# Patient Record
Sex: Female | Born: 1985 | Race: White | Hispanic: No | Marital: Single | Smoking: Former smoker
Health system: Southern US, Community
[De-identification: ages and names within clinical notes are randomized; demographics above are authoritative.]

## PROBLEM LIST (undated history)

## (undated) ENCOUNTER — Inpatient Hospital Stay (HOSPITAL_COMMUNITY): Payer: Self-pay

## (undated) DIAGNOSIS — E669 Obesity, unspecified: Secondary | ICD-10-CM

## (undated) DIAGNOSIS — R109 Unspecified abdominal pain: Secondary | ICD-10-CM

## (undated) DIAGNOSIS — N200 Calculus of kidney: Secondary | ICD-10-CM

## (undated) DIAGNOSIS — K8020 Calculus of gallbladder without cholecystitis without obstruction: Secondary | ICD-10-CM

## (undated) DIAGNOSIS — E119 Type 2 diabetes mellitus without complications: Secondary | ICD-10-CM

## (undated) DIAGNOSIS — K219 Gastro-esophageal reflux disease without esophagitis: Secondary | ICD-10-CM

## (undated) DIAGNOSIS — F32A Depression, unspecified: Secondary | ICD-10-CM

## (undated) DIAGNOSIS — F329 Major depressive disorder, single episode, unspecified: Secondary | ICD-10-CM

## (undated) DIAGNOSIS — R87619 Unspecified abnormal cytological findings in specimens from cervix uteri: Secondary | ICD-10-CM

## (undated) DIAGNOSIS — O139 Gestational [pregnancy-induced] hypertension without significant proteinuria, unspecified trimester: Secondary | ICD-10-CM

## (undated) DIAGNOSIS — IMO0002 Reserved for concepts with insufficient information to code with codable children: Secondary | ICD-10-CM

## (undated) HISTORY — PX: FOOT SURGERY: SHX648

## (undated) HISTORY — DX: Gastro-esophageal reflux disease without esophagitis: K21.9

## (undated) HISTORY — PX: TONSILLECTOMY: SUR1361

## (undated) HISTORY — DX: Unspecified abdominal pain: R10.9

---
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
## 2007-06-25 ENCOUNTER — Encounter (INDEPENDENT_AMBULATORY_CARE_PROVIDER_SITE_OTHER): Payer: Self-pay | Admitting: *Deleted

## 2008-10-21 ENCOUNTER — Emergency Department: Payer: Self-pay | Admitting: Emergency Medicine

## 2009-08-06 ENCOUNTER — Encounter: Payer: Self-pay | Admitting: Family Medicine

## 2009-08-07 ENCOUNTER — Ambulatory Visit: Payer: Self-pay | Admitting: Family Medicine

## 2009-08-07 DIAGNOSIS — Z87448 Personal history of other diseases of urinary system: Secondary | ICD-10-CM | POA: Insufficient documentation

## 2009-08-07 DIAGNOSIS — J45909 Unspecified asthma, uncomplicated: Secondary | ICD-10-CM | POA: Insufficient documentation

## 2009-08-07 DIAGNOSIS — IMO0002 Reserved for concepts with insufficient information to code with codable children: Secondary | ICD-10-CM | POA: Insufficient documentation

## 2009-08-07 DIAGNOSIS — G43909 Migraine, unspecified, not intractable, without status migrainosus: Secondary | ICD-10-CM | POA: Insufficient documentation

## 2009-08-07 DIAGNOSIS — K8020 Calculus of gallbladder without cholecystitis without obstruction: Secondary | ICD-10-CM | POA: Insufficient documentation

## 2009-08-07 DIAGNOSIS — Z9189 Other specified personal risk factors, not elsewhere classified: Secondary | ICD-10-CM | POA: Insufficient documentation

## 2009-08-07 LAB — CONVERTED CEMR LAB
Beta hcg, urine, semiquantitative: NEGATIVE
Bilirubin Urine: NEGATIVE
Ketones, urine, test strip: NEGATIVE
Specific Gravity, Urine: 1.015
Urobilinogen, UA: 0.2

## 2009-08-13 ENCOUNTER — Encounter: Payer: Self-pay | Admitting: Family Medicine

## 2009-10-08 ENCOUNTER — Telehealth: Payer: Self-pay | Admitting: Family Medicine

## 2009-10-08 ENCOUNTER — Ambulatory Visit: Payer: Self-pay | Admitting: Sports Medicine

## 2009-10-26 ENCOUNTER — Ambulatory Visit: Payer: Self-pay | Admitting: Family Medicine

## 2009-10-26 LAB — CONVERTED CEMR LAB
Albumin: 4 g/dL (ref 3.5–5.2)
Basophils Absolute: 0.1 10*3/uL (ref 0.0–0.1)
Basophils Relative: 1 % (ref 0.0–3.0)
CO2: 26 meq/L (ref 19–32)
Calcium: 9 mg/dL (ref 8.4–10.5)
Creatinine, Ser: 0.7 mg/dL (ref 0.4–1.2)
Glucose, Bld: 87 mg/dL (ref 70–99)
HDL: 47.3 mg/dL (ref >39.00)
Hemoglobin: 12.1 g/dL (ref 12.0–15.0)
Lymphocytes Relative: 40.2 % (ref 12.0–46.0)
Monocytes Relative: 7.8 % (ref 3.0–12.0)
Neutro Abs: 3.9 10*3/uL (ref 1.4–7.7)
RBC: 3.92 M/uL (ref 3.87–5.11)
Total Protein: 6.7 g/dL (ref 6.0–8.3)
Triglycerides: 55 mg/dL (ref 0.0–149.0)

## 2009-10-28 ENCOUNTER — Ambulatory Visit: Payer: Self-pay | Admitting: Family Medicine

## 2009-10-28 LAB — CONVERTED CEMR LAB
Bilirubin Urine: NEGATIVE
Glucose, Urine, Semiquant: NEGATIVE
Protein, U semiquant: NEGATIVE
Specific Gravity, Urine: 1.02
pH: 5.5

## 2009-10-30 LAB — CONVERTED CEMR LAB: Chlamydia, DNA Probe: NEGATIVE

## 2009-11-05 ENCOUNTER — Ambulatory Visit: Payer: Self-pay | Admitting: Family Medicine

## 2009-11-05 ENCOUNTER — Encounter: Payer: Self-pay | Admitting: Family Medicine

## 2009-12-30 ENCOUNTER — Encounter (INDEPENDENT_AMBULATORY_CARE_PROVIDER_SITE_OTHER): Payer: Self-pay | Admitting: *Deleted

## 2009-12-30 ENCOUNTER — Ambulatory Visit: Payer: Self-pay | Admitting: Internal Medicine

## 2010-01-09 LAB — CONVERTED CEMR LAB
Pap Smear: NORMAL
Pap Smear: NORMAL
Pap Smear: NORMAL

## 2010-02-12 ENCOUNTER — Ambulatory Visit: Payer: Self-pay | Admitting: Family Medicine

## 2010-02-12 ENCOUNTER — Encounter (INDEPENDENT_AMBULATORY_CARE_PROVIDER_SITE_OTHER): Payer: Self-pay | Admitting: *Deleted

## 2010-02-22 ENCOUNTER — Ambulatory Visit: Payer: Self-pay | Admitting: Family Medicine

## 2010-04-23 ENCOUNTER — Ambulatory Visit
Admission: RE | Admit: 2010-04-23 | Discharge: 2010-04-23 | Payer: Self-pay | Source: Home / Self Care | Attending: Family Medicine | Admitting: Family Medicine

## 2010-04-28 ENCOUNTER — Ambulatory Visit: Admit: 2010-04-28 | Payer: Self-pay | Admitting: Family Medicine

## 2010-05-04 ENCOUNTER — Other Ambulatory Visit: Payer: Self-pay | Admitting: Family Medicine

## 2010-05-04 ENCOUNTER — Other Ambulatory Visit (HOSPITAL_COMMUNITY)
Admission: RE | Admit: 2010-05-04 | Discharge: 2010-05-04 | Disposition: A | Discharge status: Home / Self Care | Payer: BC Managed Care – PPO | Source: Ambulatory Visit | Attending: Family Medicine | Admitting: Family Medicine

## 2010-05-04 ENCOUNTER — Encounter: Payer: Self-pay | Admitting: Family Medicine

## 2010-05-04 ENCOUNTER — Ambulatory Visit
Admission: RE | Admit: 2010-05-04 | Discharge: 2010-05-04 | Payer: Self-pay | Source: Home / Self Care | Attending: Family Medicine | Admitting: Family Medicine

## 2010-05-04 DIAGNOSIS — Z1159 Encounter for screening for other viral diseases: Secondary | ICD-10-CM | POA: Insufficient documentation

## 2010-05-04 DIAGNOSIS — Z113 Encounter for screening for infections with a predominantly sexual mode of transmission: Secondary | ICD-10-CM | POA: Insufficient documentation

## 2010-05-04 DIAGNOSIS — R87810 Cervical high risk human papillomavirus (HPV) DNA test positive: Secondary | ICD-10-CM | POA: Insufficient documentation

## 2010-05-04 DIAGNOSIS — Z0189 Encounter for other specified special examinations: Secondary | ICD-10-CM | POA: Insufficient documentation

## 2010-05-06 ENCOUNTER — Telehealth: Payer: Self-pay | Admitting: Family Medicine

## 2010-05-06 LAB — CONVERTED CEMR LAB
Hep B C IgM: NEGATIVE
Hepatitis B Surface Ag: NEGATIVE

## 2010-05-07 ENCOUNTER — Ambulatory Visit: Admit: 2010-05-07 | Payer: Self-pay | Admitting: Family Medicine

## 2010-05-11 NOTE — Letter (Signed)
Summary: Out of Work  [employer] at [facility]  [address] [place], [place] [address]   Phone: [phone]  Fax: [phone]    February 12, 2010   Employee:  Stefanie Kelley    To Whom It May Concern:   For Medical reasons, please excuse the above named employee from work for the following dates:  Start:  February 12, 2010 3:02 PM   End:   May return February 13, 2010  If you need additional information, please feel free to contact our office.         Sincerely,    Kerby Nora MD

## 2010-05-11 NOTE — Assessment & Plan Note (Signed)
Summary: NEW PT TO EST/CLE   Vital Signs:  Patient profile:   25 year old female Height:      66 inches Weight:      243.0 pounds BMI:     39.36 Temp:     97.9 degrees F oral Pulse rate:   72 / minute Pulse rhythm:   regular BP sitting:   110 / 72  (left arm) Cuff size:   regular  Vitals Entered By: Benny Lennert CMA Duncan Dull) (August 07, 2009 10:23 AM)  History of Present Illness: Chief complaint new patient to be established   Presyncopal spells.. had labs eval at [facility]..nml.  Intermittant in last 3 year.  In last year more spells of face flushing. lightheadedness, heart racing. Has passed out in past. Had lab eval 2 years ago.  Anxiety, intermiattant. Occ panic attacks..seen in ER once EKG normal.  Frequent headaches Has seen Dr. Willeen Cass ENT. Referred to [facility]..has appt in 1 week.  In last 3 months  B lower abdominal pressure during menses, pain with inserting tampon. Heavier menses in last few months, larger blood clots.  HAs noted several tender lumps in upper abdomen..noted in last 1 month.   Preventive Screening-Counseling & Management  Caffeine-Diet-Exercise     Does Patient Exercise: yes      Drug Use:  no.    Problems Prior to Update: 1)  Menorrhagia  (ICD-626.2) 2)  Abdominal Pain, Lower  (ICD-789.09) 3)  Hx of Pre-eclampsia  (ICD-642.40) 4)  Cholelithiasis, Asymptomatic  (ICD-574.20) 5)  Other Anxiety States  (ICD-300.09) 6)  Family History Diabetes 1st Degree Relative  (ICD-V18.0) 7)  Uti's, Hx of  (ICD-V13.00) 8)  Gerd  (ICD-530.81) 9)  Migraine Headache  (ICD-346.90) 10)  Dizziness  (ICD-780.4) 11)  Chickenpox, Hx of  (ICD-V15.9) 12)  Asthma, Unspecified, Unspecified Status  (ICD-493.90)  Current Medications (verified): 1)  None  Allergies (verified): No Known Drug Allergies  Past History:  Past medical, surgical, family and social histories (including risk factors) reviewed, and no changes noted (except as noted  below).  Past Medical History: Current Problems:  UTI'S, HX OF (ICD-V13.00) NEPHROLITHIASIS, HX OF (ICD-V13.01) GERD (ICD-530.81) ELEVATED BLOOD PRESSURE WITHOUT DIAGNOSIS OF HYPERTENSION (ICD-796.2) MIGRAINE HEADACHE (ICD-346.90) VASOVAGAL SYNCOPE (ICD-780.2) BRONCHITIS, CHRONIC (ICD-491.9) CHICKENPOX, HX OF (ICD-V15.9) ASTHMA (ICD-493.90)    Past Surgical History: tonsils and adenoids 2000  Family History: Reviewed history and no changes required. Father: limited contact, DM  mother: thyroid issues, high chol, HTN Brother: DM  Social History: Reviewed history and no changes required. Divorced Alcohol use-no Drug use-no Regular exercise-yes Drug Use:  no Does Patient Exercise:  yes  Review of Systems General:  Complains of fatigue; denies fever. CV:  Complains of palpitations; denies chest pain or discomfort, difficulty breathing at night, and fatigue. Resp:  Denies shortness of breath. GI:  Complains of constipation; denies abdominal pain, bloody stools, dark tarry stools, and diarrhea. GU:  Complains of urinary frequency; denies dysuria; burning vaginally with urination, but not with urine coming through.  Physical Exam  General:  Obese appearing female in NAD Ears:  External ear exam shows no significant lesions or deformities.  Otoscopic examination reveals clear canals, tympanic membranes are intact bilaterally without bulging, retraction, inflammation or discharge. Hearing is grossly normal bilaterally. Nose:  External nasal examination shows no deformity or inflammation. Nasal mucosa are pink and moist without lesions or exudates. Mouth:  Oral mucosa and oropharynx without lesions or exudates.  Teeth in good repair. Neck:  no  carotid bruit or thyromegaly no cervical or supraclavicular lymphadenopathy  Lungs:  Normal respiratory effort, chest expands symmetrically. Lungs are clear to auscultation, no crackles or wheezes. Heart:  Normal rate and regular rhythm.  S1 and S2 normal without gallop, murmur, click, rub or other extra sounds. Abdomen:  Bowel sounds positive,abdomen soft and mild diffuse lower abdtender without masses, organomegaly or hernias noted. 2 0.5 cm nodules in subcutaneous fat B upper abdomne..no intraabdominal lesions palpated Pulses:  R and L posterior tibial pulses are full and equal bilaterally  Extremities:  no edema  Neurologic:  No cranial nerve deficits noted. Station and gait are normal. . Sensory, motor and coordinative functions appear intact. Skin:  Intact without suspicious lesions or rashes Psych:  Cognition and judgment appear intact. Alert and cooperative with normal attention span and concentration. No apparent delusions, illusions, hallucinations   Impression & Recommendations:  Problem # 1:  DIZZINESS (ICD-780.4)  Presyncope, facial flushing and lightheadedness.    EKG showed: Will eval with labs.Marland Kitchenanemia given heavy menses, thyroid   Orders: EKG w/ Interpretation (93000)  Problem # 2:  ABDOMINAL PAIN, LOWER (ICD-789.09) UA clear Upreg  Will have her return for vaginal exam, GC/Chlam testing. If pain localized to ovaries...consider pelic [place] to eval at that time.  ? endometriosis given worse during menses...may need GYN referral.  Orders: UA Dipstick w/o Micro (manual) ([address]) Urine Pregnancy Test  ([address]454) T-Culture, Urine (09811-91478) Specimen Handling (99000)  Problem # 3:  MIGRAINE HEADACHE (ICD-346.90) [facility]   Problem # 4:  MENORRHAGIA (ICD-626.2) See #2. AEval for anemia.   Patient Instructions: 1)  Fasting LIpids, CMET Dx v77.91, cbc diff, TSH, B12 Dx 780.4 2)  Make appt for 30 min in 2 weeks for further eval of low abdominal pain.   Current Allergies (reviewed today): No known allergies   PAP Result Date:  01/09/2010 PAP Result:  normal  Laboratory Results   Urine Tests   Date/Time Reported: August 07, 2009 11:23 AM   Routine Urinalysis     Color: yellow Appearance: Cloudy Glucose: negative   (Normal Range: Negative) Bilirubin: negative   (Normal Range: Negative) Ketone: negative   (Normal Range: Negative) Spec. Gravity: 1.015   (Normal Range: 1.003-1.035) Blood: large   (Normal Range: Negative) pH: 7.0   (Normal Range: 5.0-8.0) Protein: trace   (Normal Range: Negative) Urobilinogen: 0.2   (Normal Range: 0-1) Nitrite: negative   (Normal Range: Negative) Leukocyte Esterace: moderate   (Normal Range: Negative)  Urine Microscopic WBC/HPF: occ RBC/HPF: occ Epithelial/HPF: tntc    Urine HCG: negative Comments: contaminated     Laboratory Results   Urine Tests    Routine Urinalysis   Color: yellow Appearance: Cloudy Glucose: negative   (Normal Range: Negative) Bilirubin: negative   (Normal Range: Negative) Ketone: negative   (Normal Range: Negative) Spec. Gravity: 1.015   (Normal Range: 1.003-1.035) Blood: large   (Normal Range: Negative) pH: 7.0   (Normal Range: 5.0-8.0) Protein: trace   (Normal Range: Negative) Urobilinogen: 0.2   (Normal Range: 0-1) Nitrite: negative   (Normal Range: Negative) Leukocyte Esterace: moderate   (Normal Range: Negative)  Urine Microscopic WBC/hpf: occ RBC/hpf: occ Epithelial: tntc    Urine HCG: negative Comments: contaminated

## 2010-05-11 NOTE — Assessment & Plan Note (Signed)
Summary: EARS,COUGH,HA/CLE   Vital Signs:  Patient profile:   25 year old female Height:      66 inches Weight:      246 pounds BMI:     39.85 Temp:     98.6 degrees F oral Pulse rate:   72 / minute Pulse rhythm:   regular BP sitting:   114 / 76  (left arm) Cuff size:   large  Vitals Entered By: Lewanda Rife LPN (February 12, 2010 2:43 PM) CC: Lt earaches and knot behind lt ear, non priductive cough, h/a   Acute Visit History:      The patient complains of earache, nasal discharge, sinus problems, and sore throat.  These symptoms began 2 weeks ago.  She denies fever.  Other comments include: Daughter is sick  Initiatl symptoms improved one week ago with tylenol sinus, inhaler, Nyquil.Marland Kitchensymptoms got worse again 3 days ago..nasal mucus changed color , B ears hurting..SOB and cough better..not using albuterol now. .        The character of the cough is described as nonproductive.        The earache is located on both sides.  There is no history of recent antibiotic usage or recurrent otitis media associated with the earache.        She complains of sinus pressure, teeth aching, ears being blocked, nasal congestion, and purulent drainage.             Problems Prior to Update: 1)  Cough  (ICD-786.2) 2)  Screening For Lipoid Disorders  (ICD-V77.91) 3)  Menorrhagia  (ICD-626.2) 4)  Abdominal Pain, Lower  (ICD-789.09) 5)  Hx of Pre-eclampsia  (ICD-642.40) 6)  Cholelithiasis, Asymptomatic  (ICD-574.20) 7)  Other Anxiety States  (ICD-300.09) 8)  Family History Diabetes 1st Degree Relative  (ICD-V18.0) 9)  Uti's, Hx of  (ICD-V13.00) 10)  Gerd  (ICD-530.81) 11)  Migraine Headache  (ICD-346.90) 12)  Dizziness  (ICD-780.4) 13)  Chickenpox, Hx of  (ICD-V15.9) 14)  Asthma, Unspecified, Unspecified Status  (ICD-493.90)  Current Medications (verified): 1)  Ventolin Hfa 108 (90 Base) Mcg/act Aers (Albuterol Sulfate) .... Take 2 Puffs Q4-6 Hours As Needed Wheezing/sob 2)  Tessalon Perles 100  Mg Caps (Benzonatate) .... One By Mouth Three Times A Day As Needed Cough  Allergies (verified): No Known Drug Allergies  Past History:  Past medical, surgical, family and social histories (including risk factors) reviewed, and no changes noted (except as noted below).  Past Medical History: Reviewed history from 08/07/2009 and no changes required. Current Problems:  UTI'S, HX OF (ICD-V13.00) NEPHROLITHIASIS, HX OF (ICD-V13.01) GERD (ICD-530.81) ELEVATED BLOOD PRESSURE WITHOUT DIAGNOSIS OF HYPERTENSION (ICD-796.2) MIGRAINE HEADACHE (ICD-346.90) VASOVAGAL SYNCOPE (ICD-780.2) BRONCHITIS, CHRONIC (ICD-491.9) CHICKENPOX, HX OF (ICD-V15.9) ASTHMA (ICD-493.90)    Past Surgical History: Reviewed history from 08/07/2009 and no changes required. tonsils and adenoids 2000  Family History: Reviewed history from 08/07/2009 and no changes required. Father: limited contact, DM  mother: thyroid issues, high chol, HTN Brother: DM  Social History: Reviewed history from 08/07/2009 and no changes required. Divorced Alcohol use-no Drug use-no Regular exercise-yes  Review of Systems General:  Complains of fatigue; no body aches. CV:  Denies chest pain or discomfort. Resp:  Complains of shortness of breath and wheezing; cough currently resolved.  Physical Exam  General:  Well-developed,well-nourished,in no acute distress; alert,appropriate and cooperative throughout examination Head:  ttp B maxillary sinuses Ears:  clear fluids B TMs Nose:  nasal discharge, no mucosal pallor.   Mouth:  Oral  mucosa and oropharynx without lesions or exudates.  Teeth in good repair. Neck:  no cervical or supraclavicular lymphadenopathy no carotid bruit or thyromegaly  Lungs:  Normal respiratory effort, chest expands symmetrically. Lungs are clear to auscultation, no crackles or wheezes. Heart:  Normal rate and regular rhythm. S1 and S2 normal without gallop, murmur, click, rub or other extra  sounds.   Impression & Recommendations:  Problem # 1:  SINUSITIS, ACUTE (ICD-461.9)  Her updated medication list for this problem includes:    Tessalon Perles 100 Mg Caps (Benzonatate) ..... One by mouth three times a day as needed cough    Amoxicillin 500 Mg Caps (Amoxicillin) .Marland Kitchen... 2 tab by mouth two times a day x 10 days  Instructed on treatment. Call if symptoms persist or worsen.   Complete Medication List: 1)  Ventolin Hfa 108 (90 Base) Mcg/act Aers (Albuterol sulfate) .... Take 2 puffs q4-6 hours as needed wheezing/sob 2)  Tessalon Perles 100 Mg Caps (Benzonatate) .... One by mouth three times a day as needed cough 3)  Amoxicillin 500 Mg Caps (Amoxicillin) .... 2 tab by mouth two times a day x 10 days  Patient Instructions: 1)  Start antibitoics. 2)  Complete full course. 3)  Continue nasal saline 3-4 times ad day. 4)  Mucinex two times a day.  Prescriptions: AMOXICILLIN 500 MG CAPS (AMOXICILLIN) 2 tab by mouth two times a day x 10 days  #40 x 0   Entered and Authorized by:   Kerby Nora MD   Signed by:   Kerby Nora MD on 02/12/2010   Method used:   Electronically to        [facility]* (retail)       [address]       [place], [place]  [address]       Ph: [phone]       Fax: [phone]   RxID:   [id number]    Orders Added: 1)  Est. Patient Level III [84132]    Current Allergies (reviewed today): No known allergies

## 2010-05-11 NOTE — Progress Notes (Signed)
Summary: bloody diarrhea  Phone Note Call from Patient   Caller: Patient Call For: Kerby Nora MD Summary of Call: Call from pt.  She was seen at [facility] clinic last week and was given omnicef for strep.  She developed bloody diarrhea and stopped the medicine.  The doctor there then gave her biaxin but told her not to start this until today.  She still has the bloody diarrhea so she hasnt started the medicine.  She also has bad abd pain and had fever of 101 last pm.  I advised pt that she needs to be seen and should probably call [facility] to see what they want her to do, since we dont have any appts available here today.  She did have appt scheduled here on 6/22 but she cancelled. Initial call taken by: Lowella Petties CMA,  October 08, 2009 12:30 PM  Follow-up for Phone Call        would recommend evaluation.   tomorrow is probably acceptable. Follow-up by: Hannah Beat MD,  October 08, 2009 4:59 PM  Additional Follow-up for Phone Call Additional follow up Details #1::        Please find out status of this pt...was she seen?  Additional Follow-up by: Kerby Nora MD,  October 09, 2009 2:00 PM    Additional Follow-up for Phone Call Additional follow up Details #2::    Patient is still having the same issues went to [facility] clinic yesterday and they did CT scan and she has a infection in her intestines and she has to be out of work until she has alot of other test done by GI. [facility] says its a rare infection that they dont see much.. Patient will call us back after she gets test done.Consuello Masse CMA   Follow-up by: Benny Lennert CMA Duncan Dull),  October 09, 2009 2:19 PM

## 2010-05-11 NOTE — Assessment & Plan Note (Signed)
Summary: ROA FOR 2 WEEK FOLLOW-UP/JRR   Vital Signs:  Patient profile:   25 year old female Height:      66 inches Weight:      233.4 pounds BMI:     37.81 Temp:     99.1 degrees F oral Pulse rate:   72 / minute Pulse rhythm:   regular BP sitting:   118 / 80  (left arm) Cuff size:   regular  Vitals Entered By: Benny Lennert CMA Duncan Dull) (October 28, 2009 9:20 AM)  History of Present Illness: Chief complaint 2 week follow up (Patient was to return in May for a 2 week follow up for lower abdominal pain).  Low abdominal pain chronic continues x 12 months...left lower or right lower "toothache-like pain"   Worse when on menses. Recently had worse episode..more severe lasting several hours...pain across entire lower abdomen. No nausea, no vomiting, no diarrehea, constipation.  Occ dysuria...describes as burining at urethral opening after urination..no vaginal burning or itching.  OCc greensih discharge after wiping from [employer]..not sure vag or urinary source.  No current sexual actvity...but has in past.  no pain with sex...last sex 1-2 years ago.    Did have salmonella in 09/2009 causing bloody diarrhea... resolved now after Cipro.  Had nml CT scan of abd and pelvis nml. 9 ABove low abdominal pain was not connected to symptoms from salmonella)  Last pap in 01/2010  Last OV..neg UA, neg Upreg.  Recent labs nml.  Migraine..seen at headache clinic....no migranes in last month. Given topamax...did not tolerate.  Problems Prior to Update: 1)  Screening For Lipoid Disorders  (ICD-V77.91) 2)  Menorrhagia  (ICD-626.2) 3)  Abdominal Pain, Lower  (ICD-789.09) 4)  Hx of Pre-eclampsia  (ICD-642.40) 5)  Cholelithiasis, Asymptomatic  (ICD-574.20) 6)  Other Anxiety States  (ICD-300.09) 7)  Family History Diabetes 1st Degree Relative  (ICD-V18.0) 8)  Uti's, Hx of  (ICD-V13.00) 9)  Gerd  (ICD-530.81) 10)  Migraine Headache  (ICD-346.90) 11)  Dizziness  (ICD-780.4) 12)  Chickenpox, Hx  of  (ICD-V15.9) 13)  Asthma, Unspecified, Unspecified Status  (ICD-493.90)  Current Medications (verified): 1)  None  Allergies (verified): No Known Drug Allergies  Past History:  Past medical, surgical, family and social histories (including risk factors) reviewed, and no changes noted (except as noted below).  Past Medical History: Reviewed history from 08/07/2009 and no changes required. Current Problems:  UTI'S, HX OF (ICD-V13.00) NEPHROLITHIASIS, HX OF (ICD-V13.01) GERD (ICD-530.81) ELEVATED BLOOD PRESSURE WITHOUT DIAGNOSIS OF HYPERTENSION (ICD-796.2) MIGRAINE HEADACHE (ICD-346.90) VASOVAGAL SYNCOPE (ICD-780.2) BRONCHITIS, CHRONIC (ICD-491.9) CHICKENPOX, HX OF (ICD-V15.9) ASTHMA (ICD-493.90)    Past Surgical History: Reviewed history from 08/07/2009 and no changes required. tonsils and adenoids 2000  Family History: Reviewed history from 08/07/2009 and no changes required. Father: limited contact, DM  mother: thyroid issues, high chol, HTN Brother: DM  Social History: Reviewed history from 08/07/2009 and no changes required. Divorced Alcohol use-no Drug use-no Regular exercise-yes  Review of Systems General:  Complains of fatigue; denies fever and loss of appetite. CV:  Denies chest pain or discomfort. Resp:  Denies shortness of breath, sputum productive, and wheezing. GI:  Denies constipation, gas, nausea, and vomiting. GU:  Complains of dysuria; denies abnormal vaginal bleeding, urinary frequency, and urinary hesitancy.  Physical Exam  General:  obese appearing female in NAd Mouth:  Oral mucosa and oropharynx without lesions or exudates.  Teeth in good repair. Neck:  no carotid bruit or thyromegaly no cervical or supraclavicular lymphadenopathy  Lungs:  Normal respiratory effort, chest expands symmetrically. Lungs are clear to auscultation, no crackles or wheezes. Heart:  Normal rate and regular rhythm. S1 and S2 normal without gallop, murmur, click,  rub or other extra sounds. Abdomen:  ttp B lower abdomen, soft, normal bowel sounds, no guarding, no rigidity, no rebound tenderness, no abdominal hernia, no inguinal hernia, no hepatomegaly, and no splenomegaly.   Genitalia:  Pelvic Exam:        External: normal female genitalia without lesions or masses        Vagina: normal without lesions or masses        Cervix: normal without lesions or masses        Adnexa: normal bimanual exam without masses or fullness        Uterus: normal by palpation        Pap smear: not performed Pulses:  R and L posterior tibial pulses are full and equal bilaterally  Extremities:  no edema  Skin:  Intact without suspicious lesions or rashes Psych:  Cognition and judgment appear intact. Alert and cooperative with normal attention span and concentration. No apparent delusions, illusions, hallucinations   Impression & Recommendations:  Problem # 1:  ABDOMINAL PAIN, LOWER (ICD-789.09) UA clear again today . Preg test at last OV neg. Pelvic exam showed no cervical motion tenderness...GC and chlam performed and pending. Will eval ovaries with [place].  Recent CT of abd/pelvis was nml. Given association with menses...there is concern for endometriosis as cause...if US shows no cysts...consider referral to GYN or treatment emperically with OCPs.  Orders: UA Dipstick w/o Micro (manual) ([address]) T-GC Probe, genital ([address]-16606) T-Chlamydia Probe, genital (30160-10932) Radiology Referral (Radiology)  Problem # 2:  MIGRAINE HEADACHE (ICD-346.90) Improved. Treated with OTC meds. Rec migraine diary. No current indication for prophylaxsis at current frequency.   Patient Instructions: 1)  Schediule CPX in 02/2010. 2)  No labs prior. 3)  Referral Appointment Information 4)  Day/Date: 5)  Time: 6)  Place/MD: 7)  Address: 8)  Phone/Fax: 9)  Patient given appointment information. Information/Orders faxed/mailed.   Prior Medications (reviewed today): None Current  Allergies (reviewed today): No known allergies   Laboratory Results   Urine Tests  Date/Time Received: October 28, 2009 10:25 AM  Date/Time Reported: October 28, 2009 10:25 AM   Routine Urinalysis   Color: yellow Appearance: Clear Glucose: negative   (Normal Range: Negative) Bilirubin: negative   (Normal Range: Negative) Ketone: negative   (Normal Range: Negative) Spec. Gravity: 1.020   (Normal Range: 1.003-1.035) Blood: negative   (Normal Range: Negative) pH: 5.5   (Normal Range: 5.0-8.0) Protein: negative   (Normal Range: Negative) Urobilinogen: 0.2   (Normal Range: 0-1) Nitrite: negative   (Normal Range: Negative) Leukocyte Esterace: negative   (Normal Range: Negative)

## 2010-05-11 NOTE — Letter (Signed)
Summary: Out of Work  [employer] at [facility]  [address] [place], [place] [address]   Phone: [phone]  Fax: [phone]    December 30, 2009   Employee:  Priscella Mann    To Whom It May Concern:   For Medical reasons, please excuse the above named employee from work for the following dates:  Start:  December 30, 2009   End:  December 30, 2009   If you need additional information, please feel free to contact our office.         Sincerely,       Selena Batten Dance CMA (AAMA)

## 2010-05-11 NOTE — Assessment & Plan Note (Signed)
Summary: cough and body aches   Vital Signs:  Patient profile:   25 year old female Weight:      245.75 pounds O2 Sat:      99 % on Room air Temp:     98.3 degrees F oral Pulse rate:   80 / minute Pulse rhythm:   regular BP sitting:   118 / 78  (left arm) Cuff size:   large  Vitals Entered By: Selena Batten Dance CMA (AAMA) (December 30, 2009 8:24 AM)  O2 Flow:  Room air CC: Cough/body aches/SOB   History of Present Illness: cc: cough/body aches  1 1/2 wk ago started with HA, RN.  5 days ago work up with chest pain, then yesterday with body aches, HA, cough, RN, ear pain.  Hs tried Alleve, saline nasal spray.    No fever.  Not much sinus pressure.  No abd pain, n/v/d/c.  No tooth pain   + daughter sick with ear infections and sinus infection + grandmother sick with bronchitis and sinus infection  No smokers at home.  Current Medications (verified): 1)  Ortho-Cyclen (28) 0.25-35 Mg-Mcg Tabs (Norgestimate-Eth Estradiol) .Marland Kitchen.. 1 Tab Po Daily  Allergies (verified): No Known Drug Allergies  Past History:  Past Medical History: Last updated: 08/07/2009 Current Problems:  UTI'S, HX OF (ICD-V13.00) NEPHROLITHIASIS, HX OF (ICD-V13.01) GERD (ICD-530.81) ELEVATED BLOOD PRESSURE WITHOUT DIAGNOSIS OF HYPERTENSION (ICD-796.2) MIGRAINE HEADACHE (ICD-346.90) VASOVAGAL SYNCOPE (ICD-780.2) BRONCHITIS, CHRONIC (ICD-491.9) CHICKENPOX, HX OF (ICD-V15.9) ASTHMA (ICD-493.90)    Past Surgical History: Last updated: 08/07/2009 tonsils and adenoids 2000  Social History: Last updated: 08/07/2009 Divorced Alcohol use-no Drug use-no Regular exercise-yes PMH-FH-SH reviewed for relevance  Review of Systems       per HPI  Physical Exam  General:  obese appearing female in NAD Head:  Normocephalic and atraumatic without obvious abnormalities. No apparent alopecia or balding. Eyes:  No corneal or conjunctival inflammation noted. EOMI. Perrla.  Ears:  External ear exam shows no  significant lesions or deformities.  Otoscopic examination reveals clear canals, tympanic membranes are intact bilaterally without bulging, retraction, inflammation or discharge. Hearing is grossly normal bilaterally. Nose:  External nasal examination shows no deformity or inflammation. swollen turbinates Mouth:  Oral mucosa and oropharynx without lesions or exudates.  Teeth in good repair.  + cobblestoning Neck:  no carotid bruit or thyromegaly no cervical or supraclavicular lymphadenopathy  Lungs:  Normal respiratory effort, chest expands symmetrically. Lungs are clear to auscultation, no crackles or wheezes. Heart:  Normal rate and regular rhythm. S1 and S2 normal without gallop, murmur, click, rub or other extra sounds. Abdomen:  Bowel sounds positive,abdomen soft and non-tender without masses, organomegaly or hernias noted. Pulses:  2 rad pulses Extremities:  no edema Skin:  Intact without suspicious lesions or rashes   Impression & Recommendations:  Problem # 1:  COUGH (ICD-786.2) likely post-viral, could also be influenza given body aches.  However no fever, outside window for tamiflu.  h/o asthma but well controlled off controller med.  Rec schedule albuterol for next 2-3 days, tessalon perles for cough.  RTC for red flags discussed.  Complete Medication List: 1)  Ortho-cyclen (28) 0.25-35 Mg-mcg Tabs (Norgestimate-eth estradiol) .Marland Kitchen.. 1 tab po daily 2)  Ventolin Hfa 108 (90 Base) Mcg/act Aers (Albuterol sulfate) .... Take 2 puffs q4-6 hours as needed wheezing/sob 3)  Tessalon Perles 100 Mg Caps (Benzonatate) .... One by mouth three times a day as needed cough  Patient Instructions: 1)  Sounds like you have a viral  upper respiratory infection or possibly flu. 2)  Antibiotics are not needed for this.  Viral infections usually take 7-10 days to resolve.  The cough can last up to 4-6 weeks to go away. 3)  Recommend using albuterol inhaler every 4-6 hours for next 2-3 days. 4)  Use  medication as prescribed: tessalon perles for cough. 5)  Please return if you are not improving as expected, or if you have high fevers (>101.5) or difficulty swallowing. 6)  Call clinic with questions.  Pleasure to see you today!  Prescriptions: TESSALON PERLES 100 MG CAPS (BENZONATATE) one by mouth three times a day as needed cough  #40 x 0   Entered and Authorized by:   Eustaquio Boyden  MD   Signed by:   Eustaquio Boyden  MD on 12/30/2009   Method used:   Electronically to        [facility]       [address]       [place], [place]  [address]       Ph: [phone]       Fax: [phone]   RxID:   [id number] VENTOLIN HFA 108 (90 BASE) MCG/ACT AERS (ALBUTEROL SULFATE) take 2 puffs q4-6 hours as needed wheezing/sob  #1 x 3   Entered and Authorized by:   Eustaquio Boyden  MD   Signed by:   Eustaquio Boyden  MD on 12/30/2009   Method used:   Electronically to        [facility]       [address]       [place], [place]  [address]       Ph: [phone]       Fax: [phone]   RxID:   [id number]   Current Allergies (reviewed today): No known allergies   Appended Document: cough and body aches has not received flu shot yet.

## 2010-05-11 NOTE — Letter (Signed)
[facility]   Imported By: Lanelle Bal 08/26/2009 12:48:31  _____________________________________________________________________  External Attachment:    Type:   Image     Comment:   External Document

## 2010-05-13 NOTE — Assessment & Plan Note (Signed)
Summary: cpx   Vital Signs:  Patient profile:   25 year old female Height:      66 inches Weight:      249.50 pounds BMI:     40.42 Temp:     98.6 degrees F oral Pulse rate:   76 / minute Pulse rhythm:   regular BP sitting:   100 / 70  (left arm) Cuff size:   large  Vitals Entered By: Benny Lennert CMA [facility] Dull) (May 04, 2010 3:22 PM)  History of Present Illness: Chief complaint cpx  The patient is here for annual wellness exam and preventative care.      Seen 10 days ago with viral URI.. treaeted supportively by Dr. Reece Agar.  Cough has improved, no ST.. but now nasal green discharge. No face pain, no ear pain, Some frontal headache. No fever. Was using mucinex and salin nasal spray...stopped this weekend.    Migraines were well controlled on topamax.. Stopped due to SE.. but headhaces remain rare.  On weight watchers diet... in last 2 weeks with MOM. Interested in phentermine to curb her appetitie...she wants to get back on this.  Not currently exercsiing.  INterested in birth control.. cannot remember birth control.  She has become sexuslly active. Interested in IUD.  HAs one daugheter.. does not plan on having other kids.  Preventive Screening-Counseling & Management  Caffeine-Diet-Exercise     Does Patient Exercise: yes     Type of exercise: walks at work  Problems Prior to Update: 1)  Viral Uri  (ICD-465.9) 2)  Menorrhagia  (ICD-626.2) 3)  Abdominal Pain, Lower  (ICD-789.09) 4)  Hx of Pre-eclampsia  (ICD-642.40) 5)  Cholelithiasis, Asymptomatic  (ICD-574.20) 6)  Other Anxiety States  (ICD-300.09) 7)  Family History Diabetes 1st Degree Relative  (ICD-V18.0) 8)  Uti's, Hx of  (ICD-V13.00) 9)  Gerd  (ICD-530.81) 10)  Migraine Headache  (ICD-346.90) 11)  Chickenpox, Hx of  (ICD-V15.9) 12)  Asthma, Unspecified, Unspecified Status  (ICD-493.90)  Current Medications (verified): 1)  Ventolin Hfa 108 (90 Base) Mcg/act Aers (Albuterol Sulfate) .... As  Directed  Allergies (verified): No Known Drug Allergies  Past History:  Past medical, surgical, family and social histories (including risk factors) reviewed, and no changes noted (except as noted below).  Past Medical History: Reviewed history from 08/07/2009 and no changes required. Current Problems:  UTI'S, HX OF (ICD-V13.00) NEPHROLITHIASIS, HX OF (ICD-V13.01) GERD (ICD-530.81) ELEVATED BLOOD PRESSURE WITHOUT DIAGNOSIS OF HYPERTENSION (ICD-796.2) MIGRAINE HEADACHE (ICD-346.90) VASOVAGAL SYNCOPE (ICD-780.2) BRONCHITIS, CHRONIC (ICD-491.9) CHICKENPOX, HX OF (ICD-V15.9) ASTHMA (ICD-493.90)    Past Surgical History: Reviewed history from 08/07/2009 and no changes required. tonsils and adenoids 2000  Family History: Reviewed history from 08/07/2009 and no changes required. Father: limited contact, DM  mother: thyroid issues, high chol, HTN Brother: DM  Social History: Reviewed history from 08/07/2009 and no changes required. Divorced Alcohol use-no Drug use-no Regular exercise-yes  Review of Systems General:  Denies fatigue and fever. CV:  Denies chest pain or discomfort. Resp:  Denies shortness of breath. GI:  Denies abdominal pain and bloody stools. GU:  Denies abnormal vaginal bleeding and dysuria; no heartburn currently.Marland Kitchen only if eats spicy foods. . Derm:  Denies lesion(s). Psych:  Denies anxiety and depression.  Physical Exam  General:  obese appearing femlae in NAd  Ears:  External ear exam shows no significant lesions or deformities.  Otoscopic examination reveals clear canals, tympanic membranes are intact bilaterally without bulging, retraction, inflammation or discharge. Hearing is grossly normal bilaterally. Nose:  External nasal examination shows no deformity or inflammation. Nasal mucosa are pink and moist without lesions or exudates. Mouth:  Oral mucosa and oropharynx without lesions or exudates.  Teeth in good repair. Neck:   no cervical or  supraclavicular lymphadenopathy  no carotid bruit or thyromegaly  Chest Wall:  No deformities, masses, or tenderness noted. Breasts:  No mass, nodules, thickening, tenderness, bulging, retraction, inflamation, nipple discharge or skin changes noted.   Lungs:  Normal respiratory effort, chest expands symmetrically. Lungs are clear to auscultation, no crackles or wheezes. Heart:  Normal rate and regular rhythm. S1 and S2 normal without gallop, murmur, click, rub or other extra sounds. Abdomen:  Bowel sounds positive,abdomen soft and non-tender without masses, organomegaly or hernias noted. Genitalia:  Pelvic Exam:        External: normal female genitalia without lesions or masses        Vagina: normal without lesions or masses        Cervix: normal without lesions or masses        Adnexa: normal bimanual exam without masses or fullness        Uterus: normal by palpation        Pap smear: performed Pulses:  R and L posterior tibial pulses are full and equal bilaterally  Extremities:  no edema  Neurologic:  No cranial nerve deficits noted. Station and gait are normal. Plantar reflexes are down-going bilaterally. DTRs are symmetrical throughout. Sensory, motor and coordinative functions appear intact. Skin:  Intact without suspicious lesions or rashes Psych:  Cognition and judgment appear intact. Alert and cooperative with normal attention span and concentration. No apparent delusions, illusions, hallucinations   Impression & Recommendations:  Problem # 1:  Preventive Health Care (ICD-V70.0) The patient's preventative maintenance and recommended screening tests for an annual wellness exam were reviewed in full today. Brought up to date unless services declined.  Counselled on the importance of diet, exercise, and its role in overall health and mortality. The patient's FH and SH was reviewed, including their home life, tobacco status, and drug and alcohol status.      Problem # 2:   Gynecological examination-routine (ICD-V72.31) PAP pending.   Problem # 3:  SCREENING EXAMINATION FOR VENEREAL DISEASE (ICD-V74.5) New partner..STD screening.  Orders: T-Hepatitis Acute Panel (08657-84696) T-RPR (Syphilis) (29528-41324) T-HIV Antibody  (Reflex) (40102-72536) Venipuncture (64403) Specimen Handling (47425)  Problem # 4:  CONTRACEPTIVE MANAGEMENT (ICD-V25.09) Interested in mirena IUD.Marland Kitchen discussed in detail as well as other oprions. Will refer to Dr. Dayton Martes for placement.   Complete Medication List: 1)  Ventolin Hfa 108 (90 Base) Mcg/act Aers (Albuterol sulfate) .... As directed  Other Orders: Tdap => 28yrs IM (95638) Admin 1st Vaccine (75643)  Patient Instructions: 1)  Increase exercises.Marland Kitchen 3-5 times a week. 2)  Continue weight watchers diet. 3)  Can try alli to help with weight loss. 4)  If interested in further weight loss treatments consider a bariatric treatment center. 5)  Schedule appt with Dr. Dayton Martes for IUD placement.    Orders Added: 1)  Tdap => 50yrs IM [90715] 2)  Admin 1st Vaccine [90471] 3)  Est. Patient 18-39 years [99395] 4)  T-Hepatitis Acute Panel [[id number]] 5)  T-RPR (Syphilis) [32951-88416] 6)  T-HIV Antibody  (Reflex) [60630-16010] 7)  Venipuncture [93235] 8)  Specimen Handling [99000]   Immunizations Administered:  Tetanus Vaccine:    Vaccine Type: Tdap   Immunizations Administered:  Tetanus Vaccine:    Vaccine Type: Tdap  Current Allergies (reviewed today): No known  allergies   Flu Vaccine Next Due:  Refused TD Result Date:  05/04/2010 TD Result:  given TD Next Due:  10 yr      Orders Added: 1)  Tdap => 68yrs IM [90715] 2)  Admin 1st Vaccine [90471] 3)  Est. Patient 18-39 years [99395] 4)  T-Hepatitis Acute Panel [80074-22940] 5)  T-RPR (Syphilis) [45409-81191] 6)  T-HIV Antibody  (Reflex) [47829-56213] 7)  Venipuncture [08657] 8)  Specimen Handling [99000]

## 2010-05-13 NOTE — Progress Notes (Signed)
Summary: IUD placement appt cancelled  Phone Note Outgoing Call   Call placed by: Linde Gillis CMA [facility] Dull),  May 06, 2010 2:53 PM Call placed to: Patient Summary of Call: Per Dr. Dayton Martes I called patient to make sure she would be on her menstral cycle at the time of her IUD placement appt on 05/07/2010.  Spoke with patient and she is not on her menstral but still wants the IUD placed.  After speaking with Dr. Dayton Martes she will not place the IUD unless patient is on her menstral cycle.  Patient says that she has a hard time getting off work and would like to keep her appt.  I left her a message that I would be cancelling her appt here for tomorrow, she can call back to reschedule or we can refer her to GYN. Initial call taken by: Linde Gillis CMA [facility] Dull),  May 06, 2010 2:56 PM  Follow-up for Phone Call        agreed.  For a 25 year old, she needs to be mestruating so that the cervical os will not be as tight.  I do not feel comfortable performing this procedure without these circumstances.  I also do not feel comfortable with a patient telling me that I will perform against my better judgement. Ruthe Mannan MD  May 06, 2010 2:58 PM

## 2010-05-13 NOTE — Assessment & Plan Note (Signed)
Summary: CPX / LFW   Allergies: No Known Drug Allergies   Complete Medication List: 1)  Ventolin Hfa 108 (90 Base) Mcg/act Aers (Albuterol sulfate) .... As directed  Other Orders: No Charge Patient Arrived (NCPA0) (NCPA0)   Orders Added: 1)  No Charge Patient Arrived (NCPA0) [NCPA0]

## 2010-05-13 NOTE — Assessment & Plan Note (Signed)
Summary: COUGH,ST/CLE   Vital Signs:  Patient profile:   25 year old female Weight:      250 pounds Temp:     99.1 degrees F oral Pulse rate:   72 / minute Pulse rhythm:   regular BP sitting:   110 / 80  (left arm) Cuff size:   large  Vitals Entered By: Selena Batten Dance CMA (AAMA) (April 23, 2010 3:21 PM) CC: Cough and sore throat   History of Present Illness: CC: cough/congestion  3d h/o "croupy" cough, congestion and ST.  mild abd pain.  Has tried ventolin for cough.  Tried mucinex for drainage which doesn't seem to help.  Coughing more today.  Dry.  + PNDrainage.  clear mucous out of nose.   drainage for 2 wks now.  No HA, fevers/chills, n/v/d, rashes, myalgias, arthralgias.  No smokers at home.  + h/o asthma.    + daughter sick last week.  + work sick contacts.  Current Medications (verified): 1)  None  Allergies (verified): No Known Drug Allergies  Past History:  Past Medical History: Last updated: 08/07/2009 Current Problems:  UTI'S, HX OF (ICD-V13.00) NEPHROLITHIASIS, HX OF (ICD-V13.01) GERD (ICD-530.81) ELEVATED BLOOD PRESSURE WITHOUT DIAGNOSIS OF HYPERTENSION (ICD-796.2) MIGRAINE HEADACHE (ICD-346.90) VASOVAGAL SYNCOPE (ICD-780.2) BRONCHITIS, CHRONIC (ICD-491.9) CHICKENPOX, HX OF (ICD-V15.9) ASTHMA (ICD-493.90)    Social History: Last updated: 08/07/2009 Divorced Alcohol use-no Drug use-no Regular exercise-yes  Review of Systems       per HPI  Physical Exam  General:  Well-developed,well-nourished,in no acute distress; alert,appropriate and cooperative throughout examination Head:  no sinus tenderness Eyes:  No corneal or conjunctival inflammation noted. EOMI. Perrla.  Ears:  clear B TMs Nose:  nasal discharge, no mucosal pallor.   Mouth:  some pharyngeal erythema, no exudates Neck:  no cervical or supraclavicular lymphadenopathy no carotid bruit or thyromegaly  Lungs:  Normal respiratory effort, chest expands symmetrically. Lungs are clear to  auscultation, no crackles or wheezes. Heart:  Normal rate and regular rhythm. S1 and S2 normal without gallop, murmur, click, rub or other extra sounds. Pulses:  2+ rad pulses Extremities:  no pedal edema   Impression & Recommendations:  Problem # 1:  VIRAL URI (ICD-465.9) supportive care.  continue tessalon and delsym and nasonex. The following medications were removed from the medication list:    Tessalon Perles 100 Mg Caps (Benzonatate) ..... One by mouth three times a day as needed cough  Complete Medication List: 1)  Ventolin Hfa 108 (90 Base) Mcg/act Aers (Albuterol sulfate) .... As directed  Patient Instructions: 1)  Sounds like you have a viral upper respiratory infection. 2)  Antibiotics are not needed for this.  Viral infections usually take 7-10 days to resolve.  The cough can last 4 weeks to go away. 3)  Please return if you are not improving as expected, or if you have high fevers (>101.5) or difficulty swallowing. 4)  Call clinic with questions.  Pleasure to see you today!    Orders Added: 1)  Est. Patient Level III [04540]    Current Allergies (reviewed today): No known allergies   Appended Document: COUGH,ST/CLE correction to PE: + R AC LAD

## 2010-05-14 DIAGNOSIS — R8789 Other abnormal findings in specimens from female genital organs: Secondary | ICD-10-CM | POA: Insufficient documentation

## 2010-05-24 ENCOUNTER — Encounter: Payer: Self-pay | Admitting: Family Medicine

## 2010-05-24 ENCOUNTER — Ambulatory Visit (INDEPENDENT_AMBULATORY_CARE_PROVIDER_SITE_OTHER): Payer: BC Managed Care – PPO | Admitting: Family Medicine

## 2010-05-24 DIAGNOSIS — K921 Melena: Secondary | ICD-10-CM

## 2010-05-24 DIAGNOSIS — J320 Chronic maxillary sinusitis: Secondary | ICD-10-CM

## 2010-05-24 LAB — CONVERTED CEMR LAB
Casts: 0 /lpf
Glucose, Urine, Semiquant: NEGATIVE
Ketones, urine, test strip: NEGATIVE
Nitrite: NEGATIVE
RBC / HPF: 0
WBC, UA: 0 cells/hpf
pH: 5

## 2010-06-02 NOTE — Assessment & Plan Note (Signed)
Summary: BLOOD ON RECTUM/CLE   BCBS   Vital Signs:  Patient profile:   25 year old female Weight:      249 pounds Temp:     98.7 degrees F oral Pulse rate:   60 / minute Pulse rhythm:   regular BP sitting:   102 / 60  (left arm) Cuff size:   large  Vitals Entered By: Sydell Axon LPN (May 24, 2010 2:51 PM) CC: Blood on rectum   History of Present Illness: This AM noted blood on toilet tissue brpr...feels that it is coming from rectum..  Did not have BM was simply wiping after urination... but when went over rectal area. Urinated through day.. continues with drips of blood she feels is from rectum.  Currently due for menses... but when she wiped in the front.. no blood.  Low back and low abdomen tender. Mild dysuria this AM. No constipation, no diarrhea,occ straining with BM. Has history of hemmorhoid after past pregnancy...currently don't feel irritaed. No rectal pain, no pain with BMs.  URI symptoms never resolved.. today with green nasal discharge. Tender B maxillary sinus.  No fever. No Cough, no SOB.   HAs not had any antibiotics.       Problems Prior to Update: 1)  Sinusitis, Maxillary  (ICD-473.0) 2)  Hematochezia  (ICD-578.1) 3)  Abnormal Pap Smear, Lgsil  (ICD-795.09) 4)  Contraceptive Management  (ICD-V25.09) 5)  Screening Examination For Venereal Disease  (ICD-V74.5) 6)  Routine Gynecological Examination  (ICD-V72.31) 7)  Physical Examination  (ICD-V70.0) 8)  Hx of Pre-eclampsia  (ICD-642.40) 9)  Cholelithiasis, Asymptomatic  (ICD-574.20) 10)  Family History Diabetes 1st Degree Relative  (ICD-V18.0) 11)  Uti's, Hx of  (ICD-V13.00) 12)  Migraine Headache  (ICD-346.90) 13)  Chickenpox, Hx of  (ICD-V15.9) 14)  Asthma, Unspecified, Unspecified Status  (ICD-493.90)  Current Medications (verified): 1)  Ventolin Hfa 108 (90 Base) Mcg/act Aers (Albuterol Sulfate) .... As Directed  Allergies (verified): No Known Drug Allergies  Past History:  Past  medical, surgical, family and social histories (including risk factors) reviewed, and no changes noted (except as noted below).  Past Medical History: Reviewed history from 08/07/2009 and no changes required. Current Problems:  UTI'S, HX OF (ICD-V13.00) NEPHROLITHIASIS, HX OF (ICD-V13.01) GERD (ICD-530.81) ELEVATED BLOOD PRESSURE WITHOUT DIAGNOSIS OF HYPERTENSION (ICD-796.2) MIGRAINE HEADACHE (ICD-346.90) VASOVAGAL SYNCOPE (ICD-780.2) BRONCHITIS, CHRONIC (ICD-491.9) CHICKENPOX, HX OF (ICD-V15.9) ASTHMA (ICD-493.90)    Past Surgical History: Reviewed history from 08/07/2009 and no changes required. tonsils and adenoids 2000  Family History: Reviewed history from 08/07/2009 and no changes required. Father: limited contact, DM  mother: thyroid issues, high chol, HTN Brother: DM  Social History: Reviewed history from 08/07/2009 and no changes required. Divorced Alcohol use-no Drug use-no Regular exercise-yes  Physical Exam  General:  opverwieght female in NAD  Head:  ttp B maxillary sinuses  Ears:  clear fluid B TMS  Nose:  External nasal examination shows no deformity or inflammation. Nasal mucosa are pink and moist without lesions or exudates. Mouth:  MMM Neck:  no carotid bruit or thyromegaly .ndoes  Lungs:  Normal respiratory effort, chest expands symmetrically. Lungs are clear to auscultation, no crackles or wheezes. Heart:  Normal rate and regular rhythm. S1 and S2 normal without gallop, murmur, click, rub or other extra sounds. Abdomen:  Bowel sounds positive,abdomen soft and non-tender without masses, organomegaly or hernias noted. Rectal:  no external abnormalities, normal sphincter tone, no masses, stool positive for occult blood, and internal hemorrhoid(s).  Small oozing int hemmorhoid with clot visualized.    Impression & Recommendations:  Problem # 1:  HEMATOCHEZIA (ICD-578.1) Assessment New Internal hemmorhoid... bleeding, now with clot.  Treat with  topical steroid two times a day .  Call if not  improving.  Increase fiber and water in diet.  Orders: UA Dipstick W/ Micro (manual) (19147) Anoscopy (82956)  Problem # 2:  SINUSITIS, MAXILLARY (ICD-473.0) Assessment: New  Given length of time of symtpoms... willl tret with antibiotics. Continue mucolytic and nasal steroid and saline spray.   Her updated medication list for this problem includes:    Amoxicillin 500 Mg Caps (Amoxicillin) .Marland Kitchen... 2 tab by mouth two times a day x 10 days  Complete Medication List: 1)  Ventolin Hfa 108 (90 Base) Mcg/act Aers (Albuterol sulfate) .... As directed 2)  Amoxicillin 500 Mg Caps (Amoxicillin) .... 2 tab by mouth two times a day x 10 days 3)  Anucort-hc 25 Mg Supp (Hydrocortisone acetate) .Marland Kitchen.. 1 supp pr two times a day  x 1 week  Patient Instructions: 1)  Take your antibiotic as prescribed until ALL of it is gone, but stop if you develop a rash or swelling and contact our office as soon as possible.  2)   Treat with topical steroid two times a day . 3)   call if not  improving. 4)   increase fiebr and water in diet.  Prescriptions: ANUCORT-HC 25 MG SUPP (HYDROCORTISONE ACETATE) 1 supp pr two times a day  x 1 week  #14 x 0   Entered and Authorized by:   Kerby Nora MD   Signed by:   Kerby Nora MD on 05/24/2010   Method used:   Electronically to        [facility]* (retail)       [address]       [place], [place]  [address]       Ph: [phone]       Fax: [phone]   RxID:   [id number] AMOXICILLIN 500 MG CAPS (AMOXICILLIN) 2 tab by mouth two times a day x 10 days  #40 x 0   Entered and Authorized by:   Kerby Nora MD   Signed by:   Kerby Nora MD on 05/24/2010   Method used:   Electronically to        [facility]* (retail)       [address]       [place], [place]  [address]       Ph: [phone]       Fax: [phone]   RxID:   [id number]    Orders Added: 1)  UA Dipstick W/ Micro (manual) [81000] 2)  Est.  Patient Level IV [20254] 3)  Anoscopy [46600]    Current Allergies (reviewed today): No known allergies   Laboratory Results   Urine Tests  Date/Time Received: May 24, 2010 3:21 PM  Date/Time Reported: May 24, 2010 3:21 PM   Routine Urinalysis   Color: yellow Appearance: Hazy Glucose: negative   (Normal Range: Negative) Bilirubin: negative   (Normal Range: Negative) Ketone: negative   (Normal Range: Negative) Spec. Gravity: 1.020   (Normal Range: 1.003-1.035) Blood: small   (Normal Range: Negative) pH: 5.0   (Normal Range: 5.0-8.0) Protein: trace   (Normal Range: Negative) Urobilinogen: 0.2   (Normal Range: 0-1) Nitrite: negative   (Normal Range: Negative) Leukocyte Esterace: trace   (Normal Range: Negative)  Urine Microscopic WBC/HPF: 0 RBC/HPF: 0 Epithelial/HPF: occ Casts/LPF: 0

## 2011-05-23 ENCOUNTER — Emergency Department (HOSPITAL_COMMUNITY): Payer: Self-pay

## 2011-05-23 ENCOUNTER — Other Ambulatory Visit: Payer: Self-pay

## 2011-05-23 ENCOUNTER — Emergency Department (HOSPITAL_COMMUNITY)
Admission: EM | Admit: 2011-05-23 | Discharge: 2011-05-23 | Disposition: A | Discharge status: Home / Self Care | Payer: Self-pay | Attending: Emergency Medicine | Admitting: Emergency Medicine

## 2011-05-23 ENCOUNTER — Encounter (HOSPITAL_COMMUNITY): Payer: Self-pay | Admitting: Emergency Medicine

## 2011-05-23 DIAGNOSIS — R0602 Shortness of breath: Secondary | ICD-10-CM | POA: Insufficient documentation

## 2011-05-23 DIAGNOSIS — R509 Fever, unspecified: Secondary | ICD-10-CM | POA: Insufficient documentation

## 2011-05-23 DIAGNOSIS — R059 Cough, unspecified: Secondary | ICD-10-CM | POA: Insufficient documentation

## 2011-05-23 DIAGNOSIS — E669 Obesity, unspecified: Secondary | ICD-10-CM | POA: Insufficient documentation

## 2011-05-23 DIAGNOSIS — R079 Chest pain, unspecified: Secondary | ICD-10-CM | POA: Insufficient documentation

## 2011-05-23 DIAGNOSIS — R61 Generalized hyperhidrosis: Secondary | ICD-10-CM | POA: Insufficient documentation

## 2011-05-23 DIAGNOSIS — J40 Bronchitis, not specified as acute or chronic: Secondary | ICD-10-CM | POA: Insufficient documentation

## 2011-05-23 DIAGNOSIS — R05 Cough: Secondary | ICD-10-CM | POA: Insufficient documentation

## 2011-05-23 HISTORY — DX: Obesity, unspecified: E66.9

## 2011-05-23 MED ORDER — HYDROCOD POLST-CHLORPHEN POLST 10-8 MG/5ML PO LQCR: 5.0000 mL | Freq: Two times a day (BID) | ORAL | Status: DC | PRN

## 2011-05-23 MED ORDER — DOXYCYCLINE HYCLATE 100 MG PO CAPS: 100.0000 mg | ORAL_CAPSULE | Freq: Two times a day (BID) | ORAL | Status: AC

## 2011-05-23 NOTE — Discharge Instructions (Signed)
Your chest x-ray does not show pneumonia.  Stop smoking.  Use doxycycline for infection and Tussionex for cough.  Followup with your Dr. if your symptoms.  Last more than a week.  Return for worse or uncontrolled symptoms.

## 2011-05-23 NOTE — ED Notes (Signed)
Cough and sob that started last week got worse over the weekend  Hurts to cough and take a deep breatgh

## 2011-05-23 NOTE — ED Provider Notes (Signed)
History     CSN: [id number]  Arrival date & time 05/23/11  1234   First MD Initiated Contact with Patient 05/23/11 1442      Chief Complaint  Patient presents with  . Shortness of Breath    (Consider location/radiation/quality/duration/timing/severity/associated sxs/prior treatment) Patient is a 26 y.o. female presenting with shortness of breath. The history is provided by the patient and the spouse.  Shortness of Breath  Associated symptoms include chest pain, a fever, cough and shortness of breath. Pertinent negatives include no stridor and no wheezing.   45, old female, who smokes cigarettes, presents to emergency department complaining of weeklong history of a nonproductive cough, shortness of breath, pleuritic chest pain, and fevers, chills, and sweating.  She denies nausea, or vomiting.  She denies leg pain or swelling.  She has not had recent trip or surgery.  She is on birth control pills.  Past Medical History  Diagnosis Date  . Obesity     Past Surgical History  Procedure Date  . Tonsillectomy     No family history on file.  History  Substance Use Topics  . Smoking status: Current Everyday Smoker  . Smokeless tobacco: Not on file  . Alcohol Use: Yes    OB History    Grav Para Term Preterm Abortions TAB SAB Ect Mult Living                  Review of Systems  Constitutional: Positive for fever, chills and diaphoresis.  HENT: Negative for congestion.   Respiratory: Positive for cough and shortness of breath. Negative for wheezing and stridor.   Cardiovascular: Positive for chest pain. Negative for palpitations and leg swelling.  Gastrointestinal: Negative for nausea and vomiting.  All other systems reviewed and are negative.    Allergies  Review of patient's allergies indicates no known allergies.  Home Medications   Current Outpatient Rx  Name Route Sig Dispense Refill  . IBUPROFEN 200 MG PO TABS Oral Take 400 mg by mouth every 6 (six) hours as  needed. For pain      BP 112/72  Pulse 96  Temp(Src) 98.1 F (36.7 C) (Oral)  Resp 18  SpO2 100%  LMP 04/27/2011  Physical Exam  Vitals reviewed. Constitutional: She is oriented to person, place, and time. She appears well-developed and well-nourished. No distress.       Morbidly obese  HENT:  Head: Normocephalic and atraumatic.  Eyes: Pupils are equal, round, and reactive to light.  Neck: Normal range of motion.  Cardiovascular: Normal rate, regular rhythm and normal heart sounds.   No murmur heard. Pulmonary/Chest: Effort normal and breath sounds normal. No respiratory distress. She has no wheezes. She has no rales.  Abdominal: Soft. She exhibits no distension and no mass. There is no tenderness. There is no rebound and no guarding.  Musculoskeletal: Normal range of motion. She exhibits no edema and no tenderness.  Neurological: She is alert and oriented to person, place, and time. No cranial nerve deficit.  Skin: Skin is warm and dry. No rash noted. No erythema.  Psychiatric: She has a normal mood and affect. Her behavior is normal.    ED Course  Procedures (including critical care time) 26 year old female, presents with a nonproductive cough for one week with fevers, chills, diaphoresis, and chest pain.  She smokes cigarettes.  She has no risk factors for pulmonary embolism.  Chest x-ray is consistent with bronchitis.  She is not toxic or in respiratory distress.  She  has no hypoxia.  Labs Reviewed - No data to display Dg Chest 2 View  05/23/2011  *RADIOLOGY REPORT*  Clinical Data: Cough, shortness of breath, smoker  CHEST - 2 VIEW  Comparison: None  Findings: Normal heart size, mediastinal contours, and pulmonary vascularity. Mild peribronchial thickening. No pulmonary infiltrate, pleural effusion, or pneumothorax. Bones unremarkable.  IMPRESSION: Minimal bronchitic changes without infiltrate.  Original Report Authenticated By: Lollie Marrow, M.D.     No diagnosis  found.    MDM  Bronchitis No pneumonia, respiratory distress, hypoxia No risk factors for pulmonary embolus        Nicholes Stairs, MD 05/23/11 1457

## 2011-06-16 ENCOUNTER — Emergency Department: Payer: Self-pay | Admitting: Emergency Medicine

## 2011-06-16 LAB — COMPREHENSIVE METABOLIC PANEL
Albumin: 4.3 g/dL (ref 3.4–5.0)
Alkaline Phosphatase: 52 U/L (ref 50–136)
Bilirubin,Total: 1 mg/dL (ref 0.2–1.0)
Calcium, Total: 9.5 mg/dL (ref 8.5–10.1)
Creatinine: 0.9 mg/dL (ref 0.60–1.30)
Glucose: 97 mg/dL (ref 65–99)
Osmolality: 280 (ref 275–301)
Potassium: 4.2 mmol/L (ref 3.5–5.1)
Sodium: 141 mmol/L (ref 136–145)
Total Protein: 8.1 g/dL (ref 6.4–8.2)

## 2011-06-16 LAB — URINALYSIS, COMPLETE
Bilirubin,UR: NEGATIVE
Glucose,UR: NEGATIVE mg/dL (ref 0–75)
Ketone: NEGATIVE
Nitrite: NEGATIVE
Protein: NEGATIVE
RBC,UR: 8 /HPF (ref 0–5)
Specific Gravity: 1.02 (ref 1.003–1.030)
WBC UR: 13 /HPF (ref 0–5)

## 2011-06-16 LAB — CBC
HCT: 42.7 % (ref 35.0–47.0)
MCHC: 34.8 g/dL (ref 32.0–36.0)
MCV: 90 fL (ref 80–100)
RBC: 4.75 10*6/uL (ref 3.80–5.20)
RDW: 12.2 % (ref 11.5–14.5)
WBC: 8.2 10*3/uL (ref 3.6–11.0)

## 2011-06-16 LAB — WET PREP, GENITAL

## 2012-03-16 ENCOUNTER — Ambulatory Visit (INDEPENDENT_AMBULATORY_CARE_PROVIDER_SITE_OTHER): Payer: Self-pay | Admitting: Family Medicine

## 2012-03-16 ENCOUNTER — Encounter: Payer: Self-pay | Admitting: Family Medicine

## 2012-03-16 VITALS — BP 110/72 | HR 100 | Temp 98.6°F | Wt 255.0 lb

## 2012-03-16 DIAGNOSIS — Z113 Encounter for screening for infections with a predominantly sexual mode of transmission: Secondary | ICD-10-CM

## 2012-03-16 NOTE — Progress Notes (Signed)
  Subjective:    Patient ID: Stefanie Kelley, female    DOB: 1986/01/31, 26 y.o.   MRN: [id number]  HPI  26 yo pt of Dr. Ermalene Searing here to discuss STD testing.  Boyfriend was diagnosed with genital herpes a few weeks ago. They have not had intercourse or oral sex since.    Denies any symptoms of dysuria, vaginal discharge or genital lesions.  LMP- 03/06/2012.   Patient Active Problem List  Diagnosis  . MIGRAINE HEADACHE  . ASTHMA, UNSPECIFIED, UNSPECIFIED STATUS  . CHOLELITHIASIS, ASYMPTOMATIC  . PRE-ECLAMPSIA  . UTI'S, HX OF  . CHICKENPOX, HX OF  . ABNORMAL PAP SMEAR, LGSIL  . SINUSITIS, MAXILLARY  . HEMATOCHEZIA   Past Medical History  Diagnosis Date  . Obesity    Past Surgical History  Procedure Date  . Tonsillectomy    History  Substance Use Topics  . Smoking status: Current Every Day Smoker  . Smokeless tobacco: Not on file  . Alcohol Use: Yes   No family history on file. No Known Allergies Current Outpatient Prescriptions on File Prior to Visit  Medication Sig Dispense Refill  . chlorpheniramine-HYDROcodone (TUSSIONEX PENNKINETIC ER) 10-8 MG/5ML LQCR Take 5 mLs by mouth every 12 (twelve) hours as needed.  140 mL  0  . ibuprofen (ADVIL,MOTRIN) 200 MG tablet Take 400 mg by mouth every 6 (six) hours as needed. For pain       The PMH, PSH, Social History, Family History, Medications, and allergies have been reviewed in [facility], and have been updated if relevant.    Review of Systems See HPI    Objective:   Physical Exam BP 110/72  Pulse 100  Temp 98.6 F (37 C)  Wt 255 lb (115.667 kg)  General:  Well-developed,well-nourished,in no acute distress; alert,appropriate and cooperative throughout examination Head:  normocephalic and atraumatic.   Abdomen:  Bowel sounds positive,abdomen soft and non-tender without masses, organomegaly or hernias noted. Msk:  No deformity or scoliosis noted of thoracic or lumbar spine.   Extremities:  No clubbing, cyanosis, edema,  or deformity noted with normal full range of motion of all joints.   Neurologic:  alert & oriented X3 and gait normal.   Skin:  Intact without suspicious lesions or rashes Psych:  Cognition and judgment appear intact. Alert and cooperative with normal attention span and concentration. No apparent delusions, illusions, hallucinations     Assessment & Plan:   1. Screening for STD (sexually transmitted disease)    Asymptomatic.  Will order STD testing today.  Discussed sexual activity, pregnancy risk, and STD risk.   Orders Placed This Encounter  Procedures  . HIV Antibody  . RPR  . HSV(herpes smplx)abs-1+2(IgG+IgM)-bld  . GC/chlamydia probe amp, urine

## 2012-03-16 NOTE — Patient Instructions (Signed)
Good to see you. We will call you with your lab results.   

## 2012-03-17 LAB — HIV ANTIBODY (ROUTINE TESTING W REFLEX): HIV: NONREACTIVE

## 2012-03-19 ENCOUNTER — Other Ambulatory Visit: Payer: Self-pay | Admitting: *Deleted

## 2012-03-19 LAB — HSV(HERPES SMPLX)ABS-I+II(IGG+IGM)-BLD
HSV 1 Glycoprotein G Ab, IgG: 6.11 IV — ABNORMAL HIGH
HSV 2 Glycoprotein G Ab, IgG: 6.87 IV — ABNORMAL HIGH
Herpes Simplex Vrs I&II-IgM Ab (EIA): 1.68 INDEX — ABNORMAL HIGH

## 2012-03-19 MED ORDER — VALACYCLOVIR HCL 1 G PO TABS: 1000.0000 mg | ORAL_TABLET | Freq: Two times a day (BID) | ORAL | Status: DC

## 2012-03-19 NOTE — Telephone Encounter (Signed)
[facility]altrex called [facility].

## 2012-03-20 LAB — GC/CHLAMYDIA PROBE AMP
CT Probe RNA: NEGATIVE
GC Probe RNA: NEGATIVE

## 2012-03-31 ENCOUNTER — Emergency Department (HOSPITAL_COMMUNITY)
Admission: EM | Admit: 2012-03-31 | Discharge: 2012-03-31 | Disposition: A | Discharge status: Home / Self Care | Payer: Self-pay | Attending: Emergency Medicine | Admitting: Emergency Medicine

## 2012-03-31 ENCOUNTER — Emergency Department (HOSPITAL_COMMUNITY): Payer: Self-pay

## 2012-03-31 ENCOUNTER — Encounter (HOSPITAL_COMMUNITY): Payer: Self-pay | Admitting: *Deleted

## 2012-03-31 DIAGNOSIS — Z79899 Other long term (current) drug therapy: Secondary | ICD-10-CM | POA: Insufficient documentation

## 2012-03-31 DIAGNOSIS — N23 Unspecified renal colic: Secondary | ICD-10-CM | POA: Insufficient documentation

## 2012-03-31 DIAGNOSIS — Z87891 Personal history of nicotine dependence: Secondary | ICD-10-CM | POA: Insufficient documentation

## 2012-03-31 DIAGNOSIS — R300 Dysuria: Secondary | ICD-10-CM | POA: Insufficient documentation

## 2012-03-31 DIAGNOSIS — N201 Calculus of ureter: Secondary | ICD-10-CM | POA: Insufficient documentation

## 2012-03-31 DIAGNOSIS — E669 Obesity, unspecified: Secondary | ICD-10-CM | POA: Insufficient documentation

## 2012-03-31 DIAGNOSIS — Z3202 Encounter for pregnancy test, result negative: Secondary | ICD-10-CM | POA: Insufficient documentation

## 2012-03-31 DIAGNOSIS — R3915 Urgency of urination: Secondary | ICD-10-CM | POA: Insufficient documentation

## 2012-03-31 DIAGNOSIS — Z8719 Personal history of other diseases of the digestive system: Secondary | ICD-10-CM | POA: Insufficient documentation

## 2012-03-31 HISTORY — DX: Calculus of kidney: N20.0

## 2012-03-31 HISTORY — DX: Calculus of gallbladder without cholecystitis without obstruction: K80.20

## 2012-03-31 LAB — URINE MICROSCOPIC-ADD ON

## 2012-03-31 LAB — URINALYSIS, ROUTINE W REFLEX MICROSCOPIC
Glucose, UA: NEGATIVE mg/dL
Specific Gravity, Urine: 1.02 (ref 1.005–1.030)
pH: 6 (ref 5.0–8.0)

## 2012-03-31 LAB — POCT PREGNANCY, URINE: Preg Test, Ur: NEGATIVE

## 2012-03-31 MED ORDER — ONDANSETRON HCL 4 MG/2ML IJ SOLN
4.0000 mg | Freq: Once | INTRAMUSCULAR | Status: AC
  Administered 2012-03-31: 4 mg via INTRAVENOUS
  Filled 2012-03-31: qty 2

## 2012-03-31 MED ORDER — OXYCODONE-ACETAMINOPHEN 5-325 MG PO TABS: 1.0000 | ORAL_TABLET | ORAL | Status: DC | PRN

## 2012-03-31 MED ORDER — MORPHINE SULFATE 4 MG/ML IJ SOLN
8.0000 mg | Freq: Once | INTRAMUSCULAR | Status: AC
  Administered 2012-03-31: 8 mg via INTRAVENOUS
  Filled 2012-03-31: qty 2

## 2012-03-31 MED ORDER — HYDROMORPHONE HCL PF 1 MG/ML IJ SOLN
1.0000 mg | Freq: Once | INTRAMUSCULAR | Status: AC
  Administered 2012-03-31: 1 mg via INTRAVENOUS
  Filled 2012-03-31: qty 1

## 2012-03-31 MED ORDER — ACETAMINOPHEN 500 MG PO TABS: 1000.0000 mg | ORAL_TABLET | Freq: Once | ORAL | Status: DC

## 2012-03-31 NOTE — ED Notes (Signed)
Pt c/o of abdominal pain and back pain x4 days that has increased, sts she using the restroom more, c/o diarrhea, sts she has blood in her urine, sts she started running a fever yesterday, nausea too

## 2012-03-31 NOTE — ED Provider Notes (Signed)
Stefanie Kelley is a 26 y.o. female with colicky right flank pain for several days. She has had urinary frequency. She has gallstones and has also had some nausea and postprandial discomfort. On exam, at 19:50. She is calm, and comfortable. Abdomen is soft, and nontender..  Nursing notes, applicable records and vitals reviewed.  Radiologic Images/Reports reviewed.   CT reveals right Hydro with a small UVJ stone.  MDM: Syndrome consistent with symptomatic ureteral stone. Doubt associated gallbladder, urgency. Doubt metabolic instability, serious bacterial infection or impending vascular collapse; the patient is stable for discharge.    Plan: Home Medications- percocet; Home Treatments- rest, strain urine; Recommended follow up- Urology 1 week  Results for orders placed during the hospital encounter of 03/31/12  URINALYSIS, ROUTINE W REFLEX MICROSCOPIC      Component Value Range   Color, Urine YELLOW  YELLOW   APPearance CLOUDY (*) CLEAR   Specific Gravity, Urine 1.020  1.005 - 1.030   pH 6.0  5.0 - 8.0   Glucose, UA NEGATIVE  NEGATIVE mg/dL   Hgb urine dipstick LARGE (*) NEGATIVE   Bilirubin Urine NEGATIVE  NEGATIVE   Ketones, ur NEGATIVE  NEGATIVE mg/dL   Protein, ur NEGATIVE  NEGATIVE mg/dL   Urobilinogen, UA 0.2  0.0 - 1.0 mg/dL   Nitrite NEGATIVE  NEGATIVE   Leukocytes, UA LARGE (*) NEGATIVE  POCT PREGNANCY, URINE      Component Value Range   Preg Test, Ur NEGATIVE  NEGATIVE  URINE MICROSCOPIC-ADD ON      Component Value Range   Squamous Epithelial / LPF RARE  RARE   WBC, UA 21-50  <3 WBC/hpf   RBC / HPF 11-20  <3 RBC/hpf   Bacteria, UA MANY (*) RARE    Ct Abdomen Pelvis Wo Contrast  03/31/2012  *RADIOLOGY REPORT*  Clinical Data: Right flank and low pelvic pain.  CT ABDOMEN AND PELVIS WITHOUT CONTRAST  Technique:  Multidetector CT imaging of the abdomen and pelvis was performed following the standard protocol without intravenous contrast.  Comparison: None.  Findings: The  lung bases are clear.  Gallstones are noted without CT evidence for acute cholecystitis. Unenhanced liver, spleen, adrenal glands, pancreas, and left kidney are unremarkable.  No ascites or lymphadenopathy.  No free air.  There is mild to moderate right hydroureteronephrosis to the level of the ureterovesicular junction.  A 1 mm calculus is identified image 84 which may be adjacent to the ureterovesicular junction within the bladder, or may be lodged at the ureterovesicular junction itself. The bladder is otherwise normal in appearance. No other radiopaque calculus is identified on either side.  Uterus and ovaries are normal.  The appendix is normal.  There is trace right perinephric stranding but no adjacent fluid collection. No bowel wall thickening or focal segmental dilatation is identified.  Small fat containing umbilical hernia is noted.  No acute osseous finding.  IMPRESSION: Mild to moderate right hydroureteronephrosis to the level of the ureterovesicular junction; a 1 mm calculus is present either at the ureterovesicular junction or immediately next to it within the bladder.  This could be confirmed with prone imaging, but a stone this size is likely to pass spontaneously and the additional information would be unlikely to be clinically relevant unless the patient's symptoms worsen.   Original Report Authenticated By: Christiana Pellant, M.D.     Medical screening examination/treatment/procedure(s) were conducted as a shared visit with non-physician practitioner(s) and myself.  I personally evaluated the patient during the encounter  Flint Melter, MD 03/31/12 [phone]

## 2012-03-31 NOTE — ED Provider Notes (Signed)
History     CSN: [id number]  Arrival date & time 03/31/12  1732   First MD Initiated Contact with Patient 03/31/12 1755      Chief Complaint  Patient presents with  . Abdominal Pain    (Consider location/radiation/quality/duration/timing/severity/associated sxs/prior treatment) HPI Comments: Stefanie Kelley is a 26 y.o. female with a history of kidney and gallstones presents emergency department complaining of right-sided flank pain.  Pain is moderate and rated at a 9/10 in severity.  Pain radiates from right flank to right groin and is worsened by any vibration.  No known alleviating factors.  Onset of symptoms began approximately 4 days ago and have been gradually worsening.  Associated symptoms include fever of 101, urinary frequency, hematuria and nausea.  The history is provided by the patient.    Past Medical History  Diagnosis Date  . Obesity   . Gallstones   . Kidney stones     Past Surgical History  Procedure Date  . Tonsillectomy     No family history on file.  History  Substance Use Topics  . Smoking status: Former Games developer  . Smokeless tobacco: Not on file     Comment: Quit 11/2011  . Alcohol Use: Yes    OB History    Grav Para Term Preterm Abortions TAB SAB Ect Mult Living                  Review of Systems  Constitutional: Negative for fever, chills, diaphoresis and fatigue.  HENT: Negative for ear pain and sinus pressure.   Eyes: Negative for visual disturbance.  Respiratory: Negative for cough.   Cardiovascular: Negative for chest pain.  Gastrointestinal: Negative for nausea, vomiting and abdominal pain.  Genitourinary: Positive for dysuria, urgency and flank pain.  Skin: Negative for color change and rash.  Neurological: Negative for dizziness and headaches.  Psychiatric/Behavioral: Negative for confusion.  All other systems reviewed and are negative.    Allergies  Review of patient's allergies indicates no known allergies.  Home  Medications   Current Outpatient Rx  Name  Route  Sig  Dispense  Refill  . ALBUTEROL SULFATE HFA 108 (90 BASE) MCG/ACT IN AERS   Inhalation   Inhale 2 puffs into the lungs every 6 (six) hours as needed. wheezing         . BC HEADACHE POWDER PO   Oral   Take by mouth.         . OXYCODONE-ACETAMINOPHEN 5-325 MG PO TABS   Oral   Take 1 tablet by mouth every 4 (four) hours as needed for pain.   20 tablet   0     BP 132/78  Pulse 78  Temp 97.9 F (36.6 C) (Oral)  Resp 18  Ht 5\' 7"  (1.702 m)  Wt 241 lb (109.317 kg)  BMI 37.75 kg/m2  SpO2 95%  LMP 03/07/2012  Physical Exam  Nursing note and vitals reviewed. Constitutional: She is oriented to person, place, and time. She appears well-developed and well-nourished. She appears distressed.  HENT:  Head: Normocephalic and atraumatic.  Eyes: Conjunctivae normal and EOM are normal.  Neck: Normal range of motion. Neck supple.  Cardiovascular: Normal rate, regular rhythm and normal heart sounds.   Pulmonary/Chest: Effort normal.  Abdominal: Soft. Normal appearance and bowel sounds are normal. She exhibits no distension, no ascites and no pulsatile midline mass. There is tenderness (Right-sided CVA tenderness).       Obese.  Musculoskeletal: Normal range of motion.  Neurological: She  is alert and oriented to person, place, and time.  Skin: Skin is warm and dry. She is not diaphoretic.  Psychiatric: She has a normal mood and affect. Her behavior is normal.    ED Course  Procedures (including critical care time)  Labs Reviewed  URINALYSIS, ROUTINE W REFLEX MICROSCOPIC - Abnormal; Notable for the following:    APPearance CLOUDY (*)     Hgb urine dipstick LARGE (*)     Leukocytes, UA LARGE (*)     All other components within normal limits  URINE MICROSCOPIC-ADD ON - Abnormal; Notable for the following:    Bacteria, UA MANY (*)     All other components within normal limits  POCT PREGNANCY, URINE  URINE CULTURE    1.  Ureter colic   2. Ureteral stone       MDM  Pt care resumed by Dr. Effie Shy pening CT adn UA results. Likely etiology of Kidney stone. Dipso pending results.          Jaci Carrel, [place] 04/01/12 1946

## 2012-03-31 NOTE — ED Notes (Signed)
Pt reports sharp right sided flank pain, radiates to back since Wednesday. Noticed small amount of blood in urine. Hx of kidney stones, reports pain more severe now.

## 2012-04-01 NOTE — ED Provider Notes (Signed)
Medical screening examination/treatment/procedure(s) were performed by non-physician practitioner and as supervising physician I was immediately available for consultation/collaboration.   Niketa Turner L Darald Uzzle, MD 04/01/12 2329 

## 2012-04-02 LAB — URINE CULTURE

## 2012-04-03 NOTE — ED Notes (Signed)
+   Urine Chart sent to EDP office for review. 

## 2012-04-05 NOTE — ED Provider Notes (Signed)
Pts urine positive for Lactobacillus Urine infection. According to research this is rare and can be a sign of immune compromise.  Pt to follow-up with PCP and given urology referral.  Dr. Brunilda Payor - Urology PCP- Dr. Karie Georges  Clarithromycin 250mg  tab 1 tab PO BID for 2 weeks  Dorthula Matas, PA 04/05/12 1340

## 2012-04-06 NOTE — ED Notes (Signed)
Rx for Clarithromycin 250 mg tab 1 tab po BID for 2 weeks disp -28 r-o Patient needs  to follow up with a PCP urologist.- Dr Brunilda Payor [phone]

## 2012-04-07 ENCOUNTER — Telehealth (HOSPITAL_COMMUNITY): Payer: Self-pay | Admitting: Emergency Medicine

## 2012-04-07 NOTE — ED Notes (Signed)
Patient notified of + urine culture and Rx Clarithromycin called to [facility] [phone].

## 2012-04-07 NOTE — ED Provider Notes (Signed)
Medical screening examination/treatment/procedure(s) were performed by non-physician practitioner and as supervising physician I was immediately available for consultation/collaboration.  Marshal Eskew R. Kristilyn Coltrane, MD 04/07/12 2026 

## 2012-04-11 HISTORY — PX: CHOLECYSTECTOMY: SHX55

## 2012-04-17 ENCOUNTER — Encounter: Payer: Self-pay | Admitting: *Deleted

## 2012-04-17 ENCOUNTER — Encounter: Payer: Self-pay | Admitting: Family Medicine

## 2012-04-17 ENCOUNTER — Ambulatory Visit (INDEPENDENT_AMBULATORY_CARE_PROVIDER_SITE_OTHER): Payer: Self-pay | Admitting: Family Medicine

## 2012-04-17 VITALS — BP 110/72 | HR 98 | Temp 99.6°F | Ht 67.0 in | Wt 252.2 lb

## 2012-04-17 DIAGNOSIS — J069 Acute upper respiratory infection, unspecified: Secondary | ICD-10-CM

## 2012-04-17 DIAGNOSIS — A088 Other specified intestinal infections: Secondary | ICD-10-CM

## 2012-04-17 DIAGNOSIS — R509 Fever, unspecified: Secondary | ICD-10-CM

## 2012-04-17 DIAGNOSIS — J45909 Unspecified asthma, uncomplicated: Secondary | ICD-10-CM

## 2012-04-17 DIAGNOSIS — A084 Viral intestinal infection, unspecified: Secondary | ICD-10-CM

## 2012-04-17 LAB — POCT INFLUENZA A/B: Influenza A, POC: NEGATIVE

## 2012-04-17 MED ORDER — GUAIFENESIN-CODEINE 100-10 MG/5ML PO SYRP: 5.0000 mL | ORAL_SOLUTION | Freq: Three times a day (TID) | ORAL | Status: DC | PRN

## 2012-04-17 NOTE — Assessment & Plan Note (Signed)
No flu. Likely other viral infection. Symptomatic care.

## 2012-04-17 NOTE — Progress Notes (Signed)
  Subjective:    Patient ID: Stefanie Kelley, female    DOB: [birth year], 27 y.o.   MRN: [id number]  Cough This is a new problem. The current episode started 1 to 4 weeks ago. The problem has been waxing and waning. The cough is non-productive. Associated symptoms include ear pain, a fever, myalgias, nasal congestion and shortness of breath. Pertinent negatives include no sore throat or wheezing. Associated symptoms comments: Diarrhea, nausea off and  on for 3 weeks Cough, blurry vison, fever in last 2 days. Dizzyness in last few days  right ear pain. Risk factors: non smoker. She has tried a beta-agonist inhaler (using 2 times in last 24 hours) for the symptoms. The treatment provided mild relief. Her past medical history is significant for asthma.    Exposed to several people with flu at work.  Review of Systems  Constitutional: Positive for fever.  HENT: Positive for ear pain. Negative for sore throat.   Respiratory: Positive for cough and shortness of breath. Negative for wheezing.   Musculoskeletal: Positive for myalgias.       Objective:   Physical Exam  Constitutional: Vital signs are normal. She appears well-developed and well-nourished. She is cooperative.  Non-toxic appearance. She does not appear ill. No distress.  HENT:  Head: Normocephalic.  Right Ear: Hearing, tympanic membrane, external ear and ear canal normal. Tympanic membrane is not erythematous, not retracted and not bulging.  Left Ear: Hearing, tympanic membrane, external ear and ear canal normal. Tympanic membrane is not erythematous, not retracted and not bulging.  Nose: Mucosal edema and rhinorrhea present. Right sinus exhibits no maxillary sinus tenderness and no frontal sinus tenderness. Left sinus exhibits no maxillary sinus tenderness and no frontal sinus tenderness.  Mouth/Throat: Uvula is midline, oropharynx is clear and moist and mucous membranes are normal.  Eyes: Conjunctivae normal, EOM and lids are normal.  Pupils are equal, round, and reactive to light. No foreign bodies found.  Neck: Trachea normal and normal range of motion. Neck supple. Carotid bruit is not present. No mass and no thyromegaly present.  Cardiovascular: Normal rate, regular rhythm, S1 normal, S2 normal, normal heart sounds, intact distal pulses and normal pulses.  Exam reveals no gallop and no friction rub.   No murmur heard. Pulmonary/Chest: Effort normal and breath sounds normal. Not tachypneic. No respiratory distress. She has no decreased breath sounds. She has no wheezes. She has no rhonchi. She has no rales.  Neurological: She is alert.  Skin: Skin is warm, dry and intact. No rash noted.  Psychiatric: Her speech is normal and behavior is normal. Judgment normal. Her mood appears not anxious. Cognition and memory are normal. She does not exhibit a depressed mood.          Assessment & Plan:

## 2012-04-17 NOTE — Assessment & Plan Note (Signed)
Likely resolving viral GE that weakned her an allowed her to get URI.

## 2012-04-17 NOTE — Patient Instructions (Addendum)
No flu on testing.  Likely viral gastroenteritis and upper respiratory tract infection. Push fluids, rest.  Stay out of work until fever resolved for 24 hours. Cough suppressant at night. Mucinex DM during day for cough.

## 2012-04-18 ENCOUNTER — Telehealth: Payer: Self-pay | Admitting: Family Medicine

## 2012-04-18 NOTE — Telephone Encounter (Signed)
Diagnosed with viral infection. Symptomatic care and time recommended. Antibiotics not indicated. Have her call abck if not turning the corner in 3-4 mre days.

## 2012-04-18 NOTE — Telephone Encounter (Signed)
Patient Information:  Caller Name: Esme  Phone: [phone]  Patient: Stefanie Kelley, Stefanie Kelley  Gender: Female  DOB: [birth year]  Age: 27 Years  PCP: Kerby Nora (Family Practice)  Pregnant: No  Office Follow Up:  Does the office need to follow up with this patient?: Yes  Instructions For The Office: Please review caller request for antibiotic.  RN Note:  Per Epic, RN noted Influenza test negative.  Denies sinus pain.  No earache. Body aches present with generalized headache. Fever reduced with Tylenol.  Advised MD will be notified of call for antibiotic  ([facility]) however,from MD note 04/17/12 she was diagnosed with a viral respiratory infection and no antibiotic will help it resolve.  Hydrate and humidify. Call if symptoms of complications such as fever > 72 hours, ear, sinus pain, or chest pain,or if feels worse.  Symptoms  Reason For Call & Symptoms: Called for antibiotic.  States diagnosed with influenza at office visit 04/17/12 and MD found fluid in ear.  Asking for antibiotic that was not given or forgotten at time of visit.  Reviewed Health History In EMR: Yes  Reviewed Medications In EMR: Yes  Reviewed Allergies In EMR: Yes  Reviewed Surgeries / Procedures: Yes  Date of Onset of Symptoms: 04/16/2012  Treatments Tried: Tylenol, Albuterol MDI  Treatments Tried Worked: No  Any Fever: Yes  Fever Taken: Oral  Fever Time Of Reading: 00:00:00  Fever Last Reading: 101.2 OB / GYN:  LMP: 04/05/2012  Guideline(s) Used:  Colds  Disposition Per Guideline:   Home Care  Reason For Disposition Reached:   Colds with no complications  Advice Given:  Reassurance  It sounds like an uncomplicated cold that we can treat at home.  Colds are very common and may make you feel uncomfortable.  Colds are caused by viruses, and no medicine or "shot" will cure an uncomplicated cold.  Colds are usually not serious.  Here is some care advice that should help.  For a Runny Nose With Profuse  Discharge:   Nasal mucus and discharge helps to wash viruses and bacteria out of the nose and sinuses.  Blowing the nose is all that is needed.  For a Stuffy Nose - Use Nasal Washes:  Introduction: Saline (salt water) nasal irrigation (nasal wash) is an effective and simple home remedy for treating stuffy nose and sinus congestion. The nose can be irrigated by pouring, spraying, or squirting salt water into the nose and then letting it run back out.  Treatment for Associated Symptoms of Colds:  For muscle aches, headaches, or moderate fever (more than 101 F or 38.9 C): Take acetaminophen every 4 hours.  Cough: Use cough drops.  Hydrate: Drink adequate liquids.  Humidifier:  If the air in your home is dry, use a cool-mist humidifier  Expected Course:   Fever may last 2-3 days  Nasal discharge 7-14 days  Cough up to 2-3 weeks.

## 2012-04-19 ENCOUNTER — Telehealth: Payer: Self-pay | Admitting: *Deleted

## 2012-04-19 NOTE — Telephone Encounter (Signed)
Patient calling asking for antibiotic b/c she is still running fever, soughing up green mucus and her ear pain is worse. I gave patient note for work to be out until Monday also.  [facility].

## 2012-04-19 NOTE — Telephone Encounter (Signed)
Patient advised.

## 2012-04-19 NOTE — Telephone Encounter (Signed)
I would like to defer to Dr. B here -

## 2012-04-20 MED ORDER — AZITHROMYCIN 250 MG PO TABS: ORAL_TABLET | ORAL | Status: DC

## 2012-04-20 NOTE — Telephone Encounter (Signed)
Sent in antibiotic. If she does not improve in next 2-3 days then can fill.

## 2012-04-20 NOTE — Telephone Encounter (Signed)
Patient advised via message on machine 

## 2012-09-17 ENCOUNTER — Encounter: Payer: Self-pay | Admitting: *Deleted

## 2012-09-17 ENCOUNTER — Encounter: Payer: Self-pay | Admitting: Family Medicine

## 2012-09-17 ENCOUNTER — Ambulatory Visit (INDEPENDENT_AMBULATORY_CARE_PROVIDER_SITE_OTHER): Payer: Self-pay | Admitting: Family Medicine

## 2012-09-17 VITALS — BP 106/72 | HR 83 | Temp 98.9°F | Ht 67.0 in | Wt 246.2 lb

## 2012-09-17 DIAGNOSIS — Z8719 Personal history of other diseases of the digestive system: Secondary | ICD-10-CM

## 2012-09-17 DIAGNOSIS — R109 Unspecified abdominal pain: Secondary | ICD-10-CM | POA: Insufficient documentation

## 2012-09-17 LAB — CBC WITH DIFFERENTIAL/PLATELET
Eosinophils Absolute: 0.2 10*3/uL (ref 0.0–0.7)
Eosinophils Relative: 2.2 % (ref 0.0–5.0)
Lymphocytes Relative: 24.7 % (ref 12.0–46.0)
MCV: 90.9 fl (ref 78.0–100.0)
Monocytes Absolute: 0.7 10*3/uL (ref 0.1–1.0)
Neutrophils Relative %: 65.9 % (ref 43.0–77.0)
Platelets: 227 10*3/uL (ref 150.0–400.0)
RBC: 4.48 Mil/uL (ref 3.87–5.11)
WBC: 10.7 10*3/uL — ABNORMAL HIGH (ref 4.5–10.5)

## 2012-09-17 LAB — HEPATIC FUNCTION PANEL
Alkaline Phosphatase: 35 U/L — ABNORMAL LOW (ref 39–117)
Bilirubin, Direct: 0.1 mg/dL (ref 0.0–0.3)
Total Bilirubin: 1 mg/dL (ref 0.3–1.2)

## 2012-09-17 LAB — POCT URINALYSIS DIPSTICK
Glucose, UA: NEGATIVE
Nitrite, UA: NEGATIVE
Protein, UA: NEGATIVE
Spec Grav, UA: 1.025
Urobilinogen, UA: 0.2

## 2012-09-17 LAB — BASIC METABOLIC PANEL
BUN: 14 mg/dL (ref 6–23)
Chloride: 108 mEq/L (ref 96–112)
Creatinine, Ser: 0.8 mg/dL (ref 0.4–1.2)
Glucose, Bld: 89 mg/dL (ref 70–99)
Potassium: 4.4 mEq/L (ref 3.5–5.1)

## 2012-09-17 NOTE — Assessment & Plan Note (Signed)
With hx (per pt ) of gallstones and also n/v occas and frequent loose stool  Also ? Cervical cancer- and pt reports she does not have the  $ for surgical f/u or gyn f/u  Lab today- LFT/ cbc  Sent for [place] report / also surgical note from [place]  Enc to go back to gyn as well

## 2012-09-17 NOTE — Patient Instructions (Addendum)
Labs today  Try metamucil or citrucel daily to see if the helps diarrhea  Let us know when you think you can return to the gyn doctor  Please send to [facility] for last abdominal ultrasound and note from surgeon If your symptoms worsen- let us know

## 2012-09-17 NOTE — Progress Notes (Signed)
Subjective:    Patient ID: Stefanie Kelley, female    DOB: 1985/05/21, 27 y.o.   MRN: [id number]  HPI Here for stomach problems  For several months  Diarrhea at times/ fatigue/ occ n/v  Whole abdomen hurts- almost all the time  Does not matter what she eats or drinks - no particular triggers   She does have gallstones- and never had gallbladder out   No chance pregnant   occ BC  No ibuprofen or aleve   No longer on percocet - was px last year -   Hx of cervical cancer  - had 2 "shots" and lost to follow up due to loosing her job    No blood in stool or dark stool  Has had salmonella once in the past   Kidney stones in the past   No fever or illness   Wt is down 6 lb  Patient Active Problem List   Diagnosis Date Noted  . Abdominal pain, unspecified site 09/17/2012  . History of gallstones 09/17/2012  . Viral gastroenteritis 04/17/2012  . Viral URI 04/17/2012  . Screening for STD (sexually transmitted disease) 03/16/2012  . HEMATOCHEZIA 05/24/2010  . ABNORMAL PAP SMEAR, LGSIL 05/14/2010  . MIGRAINE HEADACHE 08/07/2009  . ASTHMA, UNSPECIFIED, UNSPECIFIED STATUS 08/07/2009  . CHOLELITHIASIS, ASYMPTOMATIC 08/07/2009  . PRE-ECLAMPSIA 08/07/2009  . UTI'S, HX OF 08/07/2009  . CHICKENPOX, HX OF 08/07/2009   Past Medical History  Diagnosis Date  . Obesity   . Gallstones   . Kidney stones    Past Surgical History  Procedure Laterality Date  . Tonsillectomy     History  Substance Use Topics  . Smoking status: Former Games developer  . Smokeless tobacco: Not on file     Comment: Quit 11/2011  . Alcohol Use: Yes     Comment: occ   No family history on file. No Known Allergies Current Outpatient Prescriptions on File Prior to Visit  Medication Sig Dispense Refill  . albuterol (PROVENTIL HFA;VENTOLIN HFA) 108 (90 BASE) MCG/ACT inhaler Inhale 2 puffs into the lungs every 6 (six) hours as needed. wheezing      . Aspirin-Salicylamide-Caffeine (BC HEADACHE POWDER PO) Take by  mouth.      . oxyCODONE-acetaminophen (PERCOCET) 5-325 MG per tablet Take 1 tablet by mouth every 4 (four) hours as needed for pain.  20 tablet  0   No current facility-administered medications on file prior to visit.    Review of Systems Review of Systems  Constitutional: Negative for fever, appetite change, fatigue and unexpected weight change.  Eyes: Negative for pain and visual disturbance.  Respiratory: Negative for cough and shortness of breath.   Cardiovascular: Negative for cp or palpitations    Gastrointestinal: Negative for blood in stool/ dark stool  Genitourinary: Negative for urgency and frequency. neg for dyruia or blood in urine  Skin: Negative for pallor or rash   Neurological: Negative for weakness, light-headedness, numbness and headaches.  Hematological: Negative for adenopathy. Does not bruise/bleed easily.  Psychiatric/Behavioral: Negative for dysphoric mood. The patient is not nervous/anxious.         Objective:   Physical Exam  Constitutional: She appears well-developed and well-nourished. No distress.  obese and well appearing   HENT:  Head: Normocephalic and atraumatic.  Mouth/Throat: Oropharynx is clear and moist.  Eyes: Conjunctivae and EOM are normal. Pupils are equal, round, and reactive to light. Right eye exhibits no discharge. Left eye exhibits no discharge. No scleral icterus.  Neck: Normal range of motion.  Neck supple.  Cardiovascular: Normal rate, regular rhythm, normal heart sounds and intact distal pulses.  Exam reveals no gallop.   Pulmonary/Chest: Effort normal and breath sounds normal. No respiratory distress. She has no wheezes. She has no rales.  Abdominal: Soft. Bowel sounds are normal. She exhibits no distension, no pulsatile liver, no fluid wave, no abdominal bruit, no ascites and no mass. There is no hepatosplenomegaly. There is tenderness in the right upper quadrant and periumbilical area. There is no rigidity, no rebound, no guarding, no  tenderness at McBurney's point and negative Murphy's sign.  Musculoskeletal: She exhibits no edema and no tenderness.  Lymphadenopathy:    She has no cervical adenopathy.  Neurological: She is alert. She has normal reflexes.  Skin: Skin is warm and dry. No rash noted. No erythema. No pallor.  No jaundice   Psychiatric: She has a normal mood and affect.          Assessment & Plan:

## 2012-09-18 ENCOUNTER — Telehealth: Payer: Self-pay | Admitting: Family Medicine

## 2012-09-18 DIAGNOSIS — R109 Unspecified abdominal pain: Secondary | ICD-10-CM

## 2012-09-18 DIAGNOSIS — K8020 Calculus of gallbladder without cholecystitis without obstruction: Secondary | ICD-10-CM

## 2012-09-18 NOTE — Telephone Encounter (Signed)
Pt would like referral for Gallbladder surgery in the [place] area.  Best number for Stefanie Kelley is [phone].

## 2012-09-18 NOTE — Telephone Encounter (Signed)
Ref to gen surgery for gallstones

## 2012-09-20 ENCOUNTER — Ambulatory Visit: Payer: Self-pay | Admitting: General Surgery

## 2012-09-24 ENCOUNTER — Telehealth: Payer: Self-pay

## 2012-09-24 NOTE — Telephone Encounter (Signed)
Pt left v/m was seen 09/17/12 and has appt to see surgeon on 09/25/12; pt request work excuse to be out of work for 09/17/12,09/18/12,09/19/12,09/24/12,09/25/12 and 09/26/12.Please advise.

## 2012-09-25 ENCOUNTER — Ambulatory Visit (INDEPENDENT_AMBULATORY_CARE_PROVIDER_SITE_OTHER): Payer: Self-pay | Admitting: General Surgery

## 2012-09-25 ENCOUNTER — Encounter: Payer: Self-pay | Admitting: General Surgery

## 2012-09-25 VITALS — BP 118/78 | HR 84 | Resp 14 | Ht 66.0 in | Wt 246.0 lb

## 2012-09-25 DIAGNOSIS — R109 Unspecified abdominal pain: Secondary | ICD-10-CM | POA: Insufficient documentation

## 2012-09-25 DIAGNOSIS — K8020 Calculus of gallbladder without cholecystitis without obstruction: Secondary | ICD-10-CM | POA: Insufficient documentation

## 2012-09-25 DIAGNOSIS — K8010 Calculus of gallbladder with chronic cholecystitis without obstruction: Secondary | ICD-10-CM | POA: Insufficient documentation

## 2012-09-25 NOTE — Progress Notes (Signed)
Patient ID: Stefanie Kelley, female   DOB: 1985/09/29, 27 y.o.   MRN: [id number]  Chief Complaint  Patient presents with  . Abdominal Pain    possible gallstones    HPI Stefanie Kelley is a 27 y.o. female who presents for an evaluation of gallbladder. The patient states she has had right upper abdominal pain, nausea, vomiting, constipation and diarrhea. Pain radiated to the back. She states she originally started having problems approximately 4-5 years ago. She states that within the last year the problem has gotten worse. The patient had a CT scan of the abdomin done in December 2013 where gallstones where noted at that time.   HPI  Past Medical History  Diagnosis Date  . Obesity   . Gallstones   . Kidney stones   . GERD (gastroesophageal reflux disease)   . Abdominal pain     Past Surgical History  Procedure Laterality Date  . Tonsillectomy    . Foot surgery      History reviewed. No pertinent family history.  Social History History  Substance Use Topics  . Smoking status: Former Games developer  . Smokeless tobacco: Not on file     Comment: Quit 11/2011  . Alcohol Use: Yes     Comment: occ    Allergies  Allergen Reactions  . Latex Itching    Current Outpatient Prescriptions  Medication Sig Dispense Refill  . albuterol (PROVENTIL HFA;VENTOLIN HFA) 108 (90 BASE) MCG/ACT inhaler Inhale 2 puffs into the lungs every 6 (six) hours as needed. wheezing      . Aspirin-Salicylamide-Caffeine (BC HEADACHE POWDER PO) Take by mouth.       No current facility-administered medications for this visit.    Review of Systems Review of Systems  Constitutional: Positive for fever and chills.  Respiratory: Negative.   Cardiovascular: Negative.   Gastrointestinal: Positive for nausea, vomiting, abdominal pain, diarrhea and constipation.    Blood pressure 118/78, pulse 84, resp. rate 14, height 5\' 6"  (1.676 m), weight 246 lb (111.585 kg), last menstrual period 08/23/2012.  Physical  Exam Physical Exam  Constitutional: She is oriented to person, place, and time. She appears well-developed and well-nourished.  Eyes: Conjunctivae are normal. No scleral icterus.  Neck: Trachea normal. No mass and no thyromegaly present.  Cardiovascular: Normal rate, regular rhythm, normal heart sounds and normal pulses.   No murmur heard. Pulmonary/Chest: Effort normal and breath sounds normal.  Abdominal: Soft. Normal appearance and bowel sounds are normal. There is no hepatosplenomegaly. There is no tenderness. No hernia.  Neurological: She is alert and oriented to person, place, and time.  Skin: Skin is warm and dry.    Data Reviewed  Prior CT reviewed. Gallstones identified  Assessment      Symptomatic cholelithiasis  Plan   Discussed cholecystectomy in full with pt-risks of surgery, benefits explained. She is agreeable. The patient will be scheduled for a cholecystectomy.     Patient's surgery has been scheduled for 10-02-12 at [facility].   Ronan Dion G 09/25/2012, 7:10 PM

## 2012-09-25 NOTE — Patient Instructions (Addendum)
The patient will be scheduled for a cholecystectomy.   Laparoscopic Cholecystectomy Laparoscopic cholecystectomy is surgery to remove the gallbladder. The gallbladder is located slightly to the right of center in the abdomen, behind the liver. It is a concentrating and storage sac for the bile produced in the liver. Bile aids in the digestion and absorption of fats. Gallbladder disease (cholecystitis) is an inflammation of your gallbladder. This condition is usually caused by a buildup of gallstones (cholelithiasis) in your gallbladder. Gallstones can block the flow of bile, resulting in inflammation and pain. In severe cases, emergency surgery may be required. When emergency surgery is not required, you will have time to prepare for the procedure. Laparoscopic surgery is an alternative to open surgery. Laparoscopic surgery usually has a shorter recovery time. Your common bile duct may also need to be examined and explored. Your caregiver will discuss this with you if he or she feels this should be done. If stones are found in the common bile duct, they may be removed. LET YOUR CAREGIVER KNOW ABOUT:  Allergies to food or medicine.  Medicines taken, including vitamins, herbs, eyedrops, over-the-counter medicines, and creams.  Use of steroids (by mouth or creams).  Previous problems with anesthetics or numbing medicines.  History of bleeding problems or blood clots.  Previous surgery.  Other health problems, including diabetes and kidney problems.  Possibility of pregnancy, if this applies. RISKS AND COMPLICATIONS All surgery is associated with risks. Some problems that may occur following this procedure include:  Infection.  Damage to the common bile duct, nerves, arteries, veins, or other internal organs such as the stomach or intestines.  Bleeding.  A stone may remain in the common bile duct. BEFORE THE PROCEDURE  Do not take aspirin for 3 days prior to surgery or blood thinners  for 1 week prior to surgery.  Do not eat or drink anything after midnight the night before surgery.  Let your caregiver know if you develop a cold or other infectious problem prior to surgery.  You should be present 60 minutes before the procedure or as directed. PROCEDURE  You will be given medicine that makes you sleep (general anesthetic). When you are asleep, your surgeon will make several small cuts (incisions) in your abdomen. One of these incisions is used to insert a small, lighted scope (laparoscope) into the abdomen. The laparoscope helps the surgeon see into your abdomen. Carbon dioxide gas will be pumped into your abdomen. The gas allows more room for the surgeon to perform your surgery. Other operating instruments are inserted through the other incisions. Laparoscopic procedures may not be appropriate when:  There is major scarring from previous surgery.  The gallbladder is extremely inflamed.  There are bleeding disorders or unexpected cirrhosis of the liver.  A pregnancy is near term.  Other conditions make the laparoscopic procedure impossible. If your surgeon feels it is not safe to continue with a laparoscopic procedure, he or she will perform an open abdominal procedure. In this case, the surgeon will make an incision to open the abdomen. This gives the surgeon a larger view and field to work within. This may allow the surgeon to perform procedures that sometimes cannot be performed with a laparoscope alone. Open surgery has a longer recovery time. AFTER THE PROCEDURE  You will be taken to the recovery area where a nurse will watch and check your progress.  You may be allowed to go home the same day.  Do not resume physical activities until directed by  your caregiver.  You may resume a normal diet and activities as directed. Document Released: 03/28/2005 Document Revised: 06/20/2011 Document Reviewed: 09/10/2010 [facility] Patient Information 2014 [place],  [place].  Patient's surgery has been scheduled for 10-02-12 at [facility].

## 2012-09-25 NOTE — Telephone Encounter (Signed)
Left voicemail letting pt know letter ready for pick-up 

## 2012-09-25 NOTE — Telephone Encounter (Signed)
Letter is in IN box for pick up

## 2012-09-26 ENCOUNTER — Other Ambulatory Visit: Payer: Self-pay | Admitting: General Surgery

## 2012-09-26 DIAGNOSIS — K8010 Calculus of gallbladder with chronic cholecystitis without obstruction: Secondary | ICD-10-CM

## 2012-10-02 ENCOUNTER — Ambulatory Visit: Payer: Self-pay | Admitting: General Surgery

## 2012-10-02 DIAGNOSIS — K8010 Calculus of gallbladder with chronic cholecystitis without obstruction: Secondary | ICD-10-CM

## 2012-10-03 ENCOUNTER — Encounter: Payer: Self-pay | Admitting: General Surgery

## 2012-10-03 LAB — PATHOLOGY REPORT

## 2012-10-08 ENCOUNTER — Encounter: Payer: Self-pay | Admitting: General Surgery

## 2012-10-15 ENCOUNTER — Ambulatory Visit (INDEPENDENT_AMBULATORY_CARE_PROVIDER_SITE_OTHER): Payer: Self-pay | Admitting: General Surgery

## 2012-10-15 ENCOUNTER — Encounter: Payer: Self-pay | Admitting: General Surgery

## 2012-10-15 VITALS — BP 130/72 | Ht 66.0 in | Wt 244.0 lb

## 2012-10-15 DIAGNOSIS — K8010 Calculus of gallbladder with chronic cholecystitis without obstruction: Secondary | ICD-10-CM

## 2012-10-15 NOTE — Patient Instructions (Addendum)
Return to normal activity. May return to work tomorrow.

## 2012-10-15 NOTE — Progress Notes (Signed)
Patient ID: Stefanie Kelley, female   DOB: 27-Nov-1985, 27 y.o.   MRN: [id number] This is an 27 year old female here for post op Lap cholecystectomy done on 10/02/12. Patient doing well. Patient reports pain is manageable and is no longer taking pain medication. Patient reports diarrhea soon after eating that started 4 days ago. No reports of nausea or vomiting.  Abdomen is soft. Port site is well healed. Lungs are clear.  Recovered well from surgery.

## 2013-01-31 ENCOUNTER — Encounter: Payer: Self-pay | Admitting: Family Medicine

## 2013-01-31 ENCOUNTER — Ambulatory Visit (INDEPENDENT_AMBULATORY_CARE_PROVIDER_SITE_OTHER): Payer: PRIVATE HEALTH INSURANCE | Admitting: Family Medicine

## 2013-01-31 VITALS — BP 102/64 | HR 77 | Temp 98.3°F | Wt 249.2 lb

## 2013-01-31 DIAGNOSIS — Z3490 Encounter for supervision of normal pregnancy, unspecified, unspecified trimester: Secondary | ICD-10-CM

## 2013-01-31 DIAGNOSIS — Z331 Pregnant state, incidental: Secondary | ICD-10-CM

## 2013-01-31 DIAGNOSIS — K5289 Other specified noninfective gastroenteritis and colitis: Secondary | ICD-10-CM

## 2013-01-31 DIAGNOSIS — K529 Noninfective gastroenteritis and colitis, unspecified: Secondary | ICD-10-CM

## 2013-01-31 DIAGNOSIS — R112 Nausea with vomiting, unspecified: Secondary | ICD-10-CM

## 2013-01-31 MED ORDER — ONDANSETRON HCL 4 MG PO TABS: 4.0000 mg | ORAL_TABLET | Freq: Three times a day (TID) | ORAL | Status: DC | PRN

## 2013-01-31 NOTE — Assessment & Plan Note (Signed)
Likely viral, not dehydrated, use zofran prn, sips of fluids o/w.  She agrees.

## 2013-01-31 NOTE — Progress Notes (Signed)
Sx started Monday.  Diarrhea (2-3x/hour), vomiting (x3 yesterday, none today).  Fever up to 101. Aches. Still with UOP, but less than prev and darker.  No rash.  Nasal congestion and HA but no ST.  Likely sick contact.  Lower abd pain, worse with diarrhea, then eases up some.  No travel, no exotic foods.  [place].  No meds for nausea yet.    Missed a period, breast tenderness recently, sexually active.  H/o pre-ecclampsia.  Meds, vitals, and allergies reviewed.   ROS: See HPI.  Otherwise, noncontributory.  GEN: nad, alert and oriented HEENT: mucous membranes moist, TM and nasal exam wnl NECK: supple w/o LA CV: rrr.  PULM: ctab, no inc wob ABD: soft, +bs, diffusely but minimally ttp w/o peritoneal signs EXT: no edema  UPREG pos

## 2013-01-31 NOTE — Patient Instructions (Addendum)
Drink sips of fluids, avoid dairy, and wash your hands.  zofran for nausea.  Out of work in the meantime.  Take care.  I would start taking a prenatal vitamin when you are feeling better.   See Shirlee Limerick about your referral- [facility].

## 2013-01-31 NOTE — Assessment & Plan Note (Signed)
Refer to OBGYN, start prenatal vitamin after NAV resolved.

## 2013-02-14 ENCOUNTER — Other Ambulatory Visit: Payer: Self-pay

## 2013-04-15 ENCOUNTER — Encounter (HOSPITAL_COMMUNITY): Payer: Self-pay

## 2013-04-15 ENCOUNTER — Inpatient Hospital Stay (HOSPITAL_COMMUNITY)
Admission: AD | Admit: 2013-04-15 | Discharge: 2013-04-15 | Disposition: A | Discharge status: Home / Self Care | Payer: Medicaid Other | Source: Ambulatory Visit | Attending: Obstetrics & Gynecology | Admitting: Obstetrics & Gynecology

## 2013-04-15 DIAGNOSIS — O99891 Other specified diseases and conditions complicating pregnancy: Secondary | ICD-10-CM | POA: Insufficient documentation

## 2013-04-15 DIAGNOSIS — R51 Headache: Secondary | ICD-10-CM | POA: Insufficient documentation

## 2013-04-15 DIAGNOSIS — R209 Unspecified disturbances of skin sensation: Secondary | ICD-10-CM | POA: Insufficient documentation

## 2013-04-15 DIAGNOSIS — IMO0002 Reserved for concepts with insufficient information to code with codable children: Secondary | ICD-10-CM | POA: Insufficient documentation

## 2013-04-15 DIAGNOSIS — O9989 Other specified diseases and conditions complicating pregnancy, childbirth and the puerperium: Principal | ICD-10-CM

## 2013-04-15 DIAGNOSIS — Z87891 Personal history of nicotine dependence: Secondary | ICD-10-CM | POA: Insufficient documentation

## 2013-04-15 HISTORY — DX: Unspecified abnormal cytological findings in specimens from cervix uteri: R87.619

## 2013-04-15 HISTORY — DX: Major depressive disorder, single episode, unspecified: F32.9

## 2013-04-15 HISTORY — DX: Gestational (pregnancy-induced) hypertension without significant proteinuria, unspecified trimester: O13.9

## 2013-04-15 HISTORY — DX: Reserved for concepts with insufficient information to code with codable children: IMO0002

## 2013-04-15 HISTORY — DX: Depression, unspecified: F32.A

## 2013-04-15 LAB — COMPREHENSIVE METABOLIC PANEL
ALBUMIN: 3.2 g/dL — AB (ref 3.5–5.2)
ALT: 28 U/L (ref 0–35)
AST: 20 U/L (ref 0–37)
Alkaline Phosphatase: 45 U/L (ref 39–117)
BILIRUBIN TOTAL: 0.3 mg/dL (ref 0.3–1.2)
BUN: 6 mg/dL (ref 6–23)
CALCIUM: 9.3 mg/dL (ref 8.4–10.5)
CO2: 22 mEq/L (ref 19–32)
CREATININE: 0.58 mg/dL (ref 0.50–1.10)
Chloride: 101 mEq/L (ref 96–112)
GFR calc Af Amer: >90 mL/min (ref >90)
Glucose, Bld: 86 mg/dL (ref 70–99)
Potassium: 3.7 mEq/L (ref 3.7–5.3)
Sodium: 137 mEq/L (ref 137–147)
Total Protein: 6.7 g/dL (ref 6.0–8.3)

## 2013-04-15 LAB — CBC
HEMATOCRIT: 35.9 % — AB (ref 36.0–46.0)
Hemoglobin: 12.6 g/dL (ref 12.0–15.0)
MCH: 30.4 pg (ref 26.0–34.0)
MCHC: 35.1 g/dL (ref 30.0–36.0)
MCV: 86.5 fL (ref 78.0–100.0)
PLATELETS: 216 10*3/uL (ref 150–400)
RBC: 4.15 MIL/uL (ref 3.87–5.11)
RDW: 13 % (ref 11.5–15.5)
WBC: 12.7 10*3/uL — ABNORMAL HIGH (ref 4.0–10.5)

## 2013-04-15 LAB — URINALYSIS, ROUTINE W REFLEX MICROSCOPIC
Bilirubin Urine: NEGATIVE
Glucose, UA: 100 mg/dL — AB
Hgb urine dipstick: NEGATIVE
KETONES UR: NEGATIVE mg/dL
NITRITE: NEGATIVE
PROTEIN: NEGATIVE mg/dL
Specific Gravity, Urine: >1.03 — ABNORMAL HIGH (ref 1.005–1.030)
UROBILINOGEN UA: 0.2 mg/dL (ref 0.0–1.0)
pH: 6 (ref 5.0–8.0)

## 2013-04-15 LAB — URINE MICROSCOPIC-ADD ON

## 2013-04-15 LAB — TSH: TSH: 1.118 u[IU]/mL (ref 0.350–4.500)

## 2013-04-15 MED ORDER — DEXAMETHASONE SODIUM PHOSPHATE 10 MG/ML IJ SOLN
10.0000 mg | Freq: Once | INTRAMUSCULAR | Status: AC
  Administered 2013-04-15: 10 mg via INTRAVENOUS
  Filled 2013-04-15: qty 1

## 2013-04-15 MED ORDER — ACETAMINOPHEN 500 MG PO TABS
1000.0000 mg | ORAL_TABLET | Freq: Once | ORAL | Status: AC
Start: 2013-04-15 — End: 2013-04-15
  Administered 2013-04-15: 1000 mg via ORAL
  Filled 2013-04-15: qty 2

## 2013-04-15 MED ORDER — METOCLOPRAMIDE HCL 10 MG PO TABS
10.0000 mg | ORAL_TABLET | Freq: Once | ORAL | Status: AC
  Administered 2013-04-15: 10 mg via ORAL
  Filled 2013-04-15: qty 1

## 2013-04-15 MED ORDER — LACTATED RINGERS IV SOLN
INTRAVENOUS | Status: DC
  Administered 2013-04-15: 125 mL/h via INTRAVENOUS

## 2013-04-15 MED ORDER — BUTALBITAL-APAP-CAFFEINE 50-325-40 MG PO TABS
1.0000 | ORAL_TABLET | Freq: Four times a day (QID) | ORAL | Status: AC | PRN
Start: 2013-04-15 — End: 2014-04-15

## 2013-04-15 MED ORDER — DIPHENHYDRAMINE HCL 50 MG/ML IJ SOLN
25.0000 mg | Freq: Once | INTRAMUSCULAR | Status: AC
  Administered 2013-04-15: 25 mg via INTRAVENOUS
  Filled 2013-04-15: qty 1

## 2013-04-15 NOTE — MAU Provider Note (Signed)
History     CSN: [id number]  Arrival date and time: 04/15/13 1532   None     Chief Complaint  Patient presents with  . Headache  . Edema  . Numbness   HPI  Stefanie Kelley is a 28 y.o. female G2P1 at 24w4dwho presents with complaints of "symptoms of pre-eclampsia". Pt started prenatal care in [place], however left because her [place]. MDorie Rank [place] With her last pregnancy at 24-26 weeks she started having protein in her urine, severe HA, spinal throbbing and facial swelling; she delivered the baby at 24-26 gestation due to preeclampsia. "Pt stayed in NICU for 1 month".  Pt presents with very similar symptoms today and is concerned that she has preeclampsia. She is experiencing severe HA, facial flushing, spinal throbbing, swelling in hands and ankles.  She took tylenol today; 500 mg PO at 1130-1200.  She rates her headache pain 9/10. Pt states she does not drink enough water during the day.   OB History   Grav Para Term Preterm Abortions TAB SAB Ect Mult Living   '2 1        1     ' Obstetric Comments   1st Menstrual Cycle: 10 1st Pregnancy:  16      Past Medical History  Diagnosis Date  . Obesity   . Gallstones   . Kidney stones   . GERD (gastroesophageal reflux disease)   . Abdominal pain   . Depression     on meds, doing ok  . Pregnancy induced hypertension   . [facility]     Past Surgical History  Procedure Laterality Date  . Tonsillectomy    . Foot surgery    . Cholecystectomy  2014    Family History  Problem Relation Age of Onset  . Diabetes Brother   . Diabetes Maternal Grandmother   . Heart disease Maternal Grandfather   . Hypertension Maternal Grandfather     History  Substance Use Topics  . Smoking status: Former SResearch scientist (life sciences) . Smokeless tobacco: Never Used     Comment: Quit 11/2011  . Alcohol Use: Yes     Comment: occ, none with preg     Allergies:  Allergies  Allergen Reactions  . Latex Itching    No prescriptions prior to admission   Results for orders placed during the hospital encounter of 04/15/13 (from the past 48 hour(s))  URINALYSIS, ROUTINE W REFLEX MICROSCOPIC     Status: Abnormal   Collection Time    04/15/13  3:50 PM      Result Value Range   Color, Urine YELLOW  YELLOW   APPearance CLEAR  CLEAR   Specific Gravity, Urine >1.030 (*) 1.005 - 1.030   pH 6.0  5.0 - 8.0   Glucose, UA 100 (*) NEGATIVE mg/dL   Hgb urine dipstick NEGATIVE  NEGATIVE   Bilirubin Urine NEGATIVE  NEGATIVE   Ketones, ur NEGATIVE  NEGATIVE mg/dL   Protein, ur NEGATIVE  NEGATIVE mg/dL   Urobilinogen, UA 0.2  0.0 - 1.0 mg/dL   Nitrite NEGATIVE  NEGATIVE   Leukocytes, UA SMALL (*) NEGATIVE  URINE MICROSCOPIC-ADD ON     Status: Abnormal   Collection Time    04/15/13  3:50 PM      Result Value Range   Squamous Epithelial / LPF MANY (*) RARE   WBC, UA  0-2  <3 WBC/hpf   Bacteria, UA MANY (*) RARE  CBC     Status: Abnormal   Collection Time    04/15/13  7:05 PM      Result Value Range   WBC 12.7 (*) 4.0 - 10.5 K/uL   RBC 4.15  3.87 - 5.11 MIL/uL   Hemoglobin 12.6  12.0 - 15.0 g/dL   HCT 35.9 (*) 36.0 - 46.0 %   MCV 86.5  78.0 - 100.0 fL   MCH 30.4  26.0 - 34.0 pg   MCHC 35.1  30.0 - 36.0 g/dL   RDW 13.0  11.5 - 15.5 %   Platelets 216  150 - 400 K/uL  COMPREHENSIVE METABOLIC PANEL     Status: Abnormal   Collection Time    04/15/13  7:05 PM      Result Value Range   Sodium 137  137 - 147 mEq/L   Potassium 3.7  3.7 - 5.3 mEq/L   Chloride 101  96 - 112 mEq/L   CO2 22  19 - 32 mEq/L   Glucose, Bld 86  70 - 99 mg/dL   BUN 6  6 - 23 mg/dL   Creatinine, Ser 0.58  0.50 - 1.10 mg/dL   Calcium 9.3  8.4 - 10.5 mg/dL   Total Protein 6.7  6.0 - 8.3 g/dL   Albumin 3.2 (*) 3.5 - 5.2 g/dL   AST 20  0 - 37 U/L   ALT 28  0 - 35 U/L   Alkaline Phosphatase 45  39 - 117 U/L   Total Bilirubin 0.3  0.3 - 1.2 mg/dL   GFR calc non Af  Amer >90  >90 mL/min   GFR calc Af Amer >90  >90 mL/min   Comment: (NOTE)     The eGFR has been calculated using the CKD EPI equation.     This calculation has not been validated in all clinical situations.     eGFR's persistently <90 mL/min signify possible Chronic Kidney     Disease.  TSH     Status: None   Collection Time    04/15/13  7:05 PM      Result Value Range   TSH 1.118  0.350 - 4.500 uIU/mL   Comment: Performed at [facility]    Review of Systems  Constitutional: Negative for fever and chills.  Eyes: Positive for blurred vision.       When she has HA   Cardiovascular: Negative for chest pain.  Gastrointestinal: Positive for nausea and diarrhea. Negative for vomiting, abdominal pain and constipation.  Genitourinary: Negative for dysuria, urgency, frequency and hematuria.       No vaginal discharge. No vaginal bleeding. No dysuria.   Neurological: Positive for dizziness and headaches.   Physical Exam   Blood pressure 108/54, pulse 72, temperature 99 F (37.2 C), temperature source Oral, resp. rate 18, last menstrual period 12/10/2012, SpO2 100.00%.  Physical Exam  Constitutional: She is oriented to person, place, and time. She appears well-developed and well-nourished.  HENT:  Head: Normocephalic.  Eyes: Pupils are equal, round, and reactive to light.  Neck: Neck supple.  Respiratory: Effort normal.  GI: Soft. She exhibits no distension and no mass. There is no hepatomegaly. There is no tenderness. There is no rigidity, no rebound, no guarding and no CVA tenderness.  Musculoskeletal: Normal range of motion.       Right ankle: She exhibits no swelling.       Left ankle:  She exhibits no swelling.       Right hand: She exhibits no swelling.       Left hand: She exhibits no swelling.  Neurological: She is alert and oriented to person, place, and time. She has normal strength and normal reflexes. She displays normal reflexes. GCS eye subscore is 4. GCS  verbal subscore is 5. GCS motor subscore is 6.  Skin: Skin is warm and intact.  Psychiatric: She has a normal mood and affect. Her behavior is normal.   Results for orders placed during the hospital encounter of 04/15/13 (from the past 24 hour(s))  URINALYSIS, ROUTINE W REFLEX MICROSCOPIC     Status: Abnormal   Collection Time    04/15/13  3:50 PM      Result Value Range   Color, Urine YELLOW  YELLOW   APPearance CLEAR  CLEAR   Specific Gravity, Urine >1.030 (*) 1.005 - 1.030   pH 6.0  5.0 - 8.0   Glucose, UA 100 (*) NEGATIVE mg/dL   Hgb urine dipstick NEGATIVE  NEGATIVE   Bilirubin Urine NEGATIVE  NEGATIVE   Ketones, ur NEGATIVE  NEGATIVE mg/dL   Protein, ur NEGATIVE  NEGATIVE mg/dL   Urobilinogen, UA 0.2  0.0 - 1.0 mg/dL   Nitrite NEGATIVE  NEGATIVE   Leukocytes, UA SMALL (*) NEGATIVE  URINE MICROSCOPIC-ADD ON     Status: Abnormal   Collection Time    04/15/13  3:50 PM      Result Value Range   Squamous Epithelial / LPF MANY (*) RARE   WBC, UA 0-2  <3 WBC/hpf   Bacteria, UA MANY (*) RARE  CBC     Status: Abnormal   Collection Time    04/15/13  7:05 PM      Result Value Range   WBC 12.7 (*) 4.0 - 10.5 K/uL   RBC 4.15  3.87 - 5.11 MIL/uL   Hemoglobin 12.6  12.0 - 15.0 g/dL   HCT 35.9 (*) 36.0 - 46.0 %   MCV 86.5  78.0 - 100.0 fL   MCH 30.4  26.0 - 34.0 pg   MCHC 35.1  30.0 - 36.0 g/dL   RDW 13.0  11.5 - 15.5 %   Platelets 216  150 - 400 K/uL  COMPREHENSIVE METABOLIC PANEL     Status: Abnormal   Collection Time    04/15/13  7:05 PM      Result Value Range   Sodium 137  137 - 147 mEq/L   Potassium 3.7  3.7 - 5.3 mEq/L   Chloride 101  96 - 112 mEq/L   CO2 22  19 - 32 mEq/L   Glucose, Bld 86  70 - 99 mg/dL   BUN 6  6 - 23 mg/dL   Creatinine, Ser 0.58  0.50 - 1.10 mg/dL   Calcium 9.3  8.4 - 10.5 mg/dL   Total Protein 6.7  6.0 - 8.3 g/dL   Albumin 3.2 (*) 3.5 - 5.2 g/dL   AST 20  0 - 37 U/L   ALT 28  0 - 35 U/L   Alkaline Phosphatase 45  39 - 117 U/L   Total  Bilirubin 0.3  0.3 - 1.2 mg/dL   GFR calc non Af Amer >90  >90 mL/min   GFR calc Af Amer >90  >90 mL/min  TSH     Status: None   Collection Time    04/15/13  7:05 PM      Result Value Range  TSH 1.118  0.350 - 4.500 uIU/mL    MAU Course  Procedures None  MDM +fht Reglan 10 mg PO Tylenol 1 gram PO PO hydration   Consulted with Dr. Hulan Fray; baseline labs recommended  CBC CMET  TSH  1945: pt rates her HA pain 7/10 and feels like the severity is decreasing.  Assessment and Plan  Report given to Rozelle Logan [facility] who resumes care of the patient   Farris Has, PA-C 04/16/2013, 12:16 AM   MDM RN reports patient continues to complain of headache rated at 7/10 now and requesting something else for pain management IV fluids ordered with decadron and benadryl Patient now rates headache pain between 0/10 and 1/10  A: Headache  P: Discharge home Rx for Fioricet given to patient Warning signs for pre-eclampsia discussed Patient given list of OB providers in [place] as requested and instructed to call to resume prenatal care ASAP Patient may return to MAU as needed or if symptoms return or worsen  Farris Has, PA-C 04/16/2013 12:16 AM

## 2013-04-15 NOTE — Discharge Instructions (Signed)

## 2013-04-15 NOTE — MAU Note (Signed)
Pt's dr moved- needs to find a new dr

## 2013-04-15 NOTE — MAU Note (Signed)
Had daughter 10 yrs ago, had pre- eclampsia.  Is preg and having same symptoms now that she had with her. Been having headaches- are getting worse this past week, no relief with tylenol, feels throbbing all the way down to spine.  Face feel numb (smile is symmetrical- speech is clear). Hands and feet are swelling,

## 2013-04-16 ENCOUNTER — Encounter: Payer: Self-pay | Admitting: Family Medicine

## 2013-04-16 ENCOUNTER — Other Ambulatory Visit: Payer: Self-pay

## 2013-04-16 LAB — HEPATIC FUNCTION PANEL A (ARMC)
Albumin: 3 g/dL — ABNORMAL LOW (ref 3.4–5.0)
Alkaline Phosphatase: 49 U/L
BILIRUBIN DIRECT: 0.1 mg/dL (ref 0.00–0.20)
Bilirubin,Total: 0.4 mg/dL (ref 0.2–1.0)
SGOT(AST): 24 U/L (ref 15–37)
SGPT (ALT): 37 U/L (ref 12–78)
Total Protein: 7.1 g/dL (ref 6.4–8.2)

## 2013-04-16 LAB — CBC WITH DIFFERENTIAL/PLATELET
Basophil #: 0 10*3/uL (ref 0.0–0.1)
Basophil %: 0.2 %
Eosinophil #: 0 10*3/uL (ref 0.0–0.7)
Eosinophil %: 0.1 %
HCT: 36.6 % (ref 35.0–47.0)
HGB: 12.8 g/dL (ref 12.0–16.0)
Lymphocyte #: 2.3 10*3/uL (ref 1.0–3.6)
Lymphocyte %: 12.7 %
MCH: 30.4 pg (ref 26.0–34.0)
MCHC: 35 g/dL (ref 32.0–36.0)
MCV: 87 fL (ref 80–100)
MONO ABS: 0.9 x10 3/mm (ref 0.2–0.9)
Monocyte %: 4.9 %
NEUTROS ABS: 15 10*3/uL — AB (ref 1.4–6.5)
Neutrophil %: 82.1 %
PLATELETS: 244 10*3/uL (ref 150–440)
RBC: 4.21 10*6/uL (ref 3.80–5.20)
RDW: 12.8 % (ref 11.5–14.5)
WBC: 18.2 10*3/uL — ABNORMAL HIGH (ref 3.6–11.0)

## 2013-04-16 LAB — CREATININE, SERUM: Creatinine: 0.53 mg/dL — ABNORMAL LOW (ref 0.60–1.30)

## 2013-04-16 LAB — URIC ACID: Uric Acid: 2.6 mg/dL (ref 2.6–6.0)

## 2013-04-16 LAB — BUN: BUN: 7 mg/dL (ref 7–18)

## 2013-04-16 LAB — CREATININE, URINE, RANDOM: Creatinine, Urine Random: 199.4 mg/dL — ABNORMAL HIGH (ref 30.0–125.0)

## 2013-04-16 LAB — PROTEIN, URINE, RANDOM: Protein, Random Urine: 20 mg/dL — ABNORMAL HIGH (ref 0–12)

## 2013-04-23 ENCOUNTER — Telehealth: Payer: Self-pay | Admitting: Family Medicine

## 2013-04-23 NOTE — Telephone Encounter (Signed)
Pt called back and she goes to [facility], labs faxed to them

## 2013-04-23 NOTE — Telephone Encounter (Signed)
We received obgyn labs on pt that we need to send to pt's obgyn.  Called pt and left voicemail requesting pt to call office back, so I can ask pt who her obgyn is and fax these labs to them

## 2013-04-25 ENCOUNTER — Encounter: Payer: Self-pay | Admitting: Maternal & Fetal Medicine

## 2013-05-08 ENCOUNTER — Encounter: Payer: Self-pay | Admitting: Family Medicine

## 2013-05-09 ENCOUNTER — Encounter: Payer: Self-pay | Admitting: Obstetrics and Gynecology

## 2013-05-12 ENCOUNTER — Encounter: Payer: Self-pay | Admitting: Maternal & Fetal Medicine

## 2013-08-22 ENCOUNTER — Ambulatory Visit: Payer: Medicaid Other | Admitting: Internal Medicine

## 2013-09-07 ENCOUNTER — Observation Stay: Payer: Self-pay | Admitting: Obstetrics & Gynecology

## 2013-09-07 LAB — URINALYSIS, COMPLETE
Bilirubin,UR: NEGATIVE
Glucose,UR: 50 mg/dL (ref 0–75)
Ketone: NEGATIVE
Nitrite: NEGATIVE
PH: 6 (ref 4.5–8.0)
Specific Gravity: 1.018 (ref 1.003–1.030)
WBC UR: 13 /HPF (ref 0–5)

## 2013-09-11 ENCOUNTER — Observation Stay: Payer: Self-pay

## 2013-09-12 LAB — CBC WITH DIFFERENTIAL/PLATELET
BASOS ABS: 0.1 10*3/uL (ref 0.0–0.1)
Basophil %: 0.6 %
EOS ABS: 0.1 10*3/uL (ref 0.0–0.7)
Eosinophil %: 0.7 %
HCT: 37.1 % (ref 35.0–47.0)
HGB: 12.4 g/dL (ref 12.0–16.0)
LYMPHS ABS: 3.4 10*3/uL (ref 1.0–3.6)
LYMPHS PCT: 23.9 %
MCH: 29.7 pg (ref 26.0–34.0)
MCHC: 33.4 g/dL (ref 32.0–36.0)
MCV: 89 fL (ref 80–100)
Monocyte #: 1.2 x10 3/mm — ABNORMAL HIGH (ref 0.2–0.9)
Monocyte %: 8.2 %
Neutrophil #: 9.4 10*3/uL — ABNORMAL HIGH (ref 1.4–6.5)
Neutrophil %: 66.6 %
Platelet: 237 10*3/uL (ref 150–440)
RBC: 4.18 10*6/uL (ref 3.80–5.20)
RDW: 14.4 % (ref 11.5–14.5)
WBC: 14.1 10*3/uL — ABNORMAL HIGH (ref 3.6–11.0)

## 2013-09-16 ENCOUNTER — Inpatient Hospital Stay: Payer: Self-pay | Admitting: Obstetrics and Gynecology

## 2013-09-17 LAB — CBC WITH DIFFERENTIAL/PLATELET
BASOS PCT: 0.3 %
Basophil #: 0 10*3/uL (ref 0.0–0.1)
EOS PCT: 0.3 %
Eosinophil #: 0 10*3/uL (ref 0.0–0.7)
HCT: 36.3 % (ref 35.0–47.0)
HGB: 11.7 g/dL — ABNORMAL LOW (ref 12.0–16.0)
LYMPHS ABS: 2.8 10*3/uL (ref 1.0–3.6)
LYMPHS PCT: 21.4 %
MCH: 28.6 pg (ref 26.0–34.0)
MCHC: 32.2 g/dL (ref 32.0–36.0)
MCV: 89 fL (ref 80–100)
MONOS PCT: 7.8 %
Monocyte #: 1 x10 3/mm — ABNORMAL HIGH (ref 0.2–0.9)
NEUTROS PCT: 70.2 %
Neutrophil #: 9.3 10*3/uL — ABNORMAL HIGH (ref 1.4–6.5)
PLATELETS: 236 10*3/uL (ref 150–440)
RBC: 4.08 10*6/uL (ref 3.80–5.20)
RDW: 14.1 % (ref 11.5–14.5)
WBC: 13.3 10*3/uL — ABNORMAL HIGH (ref 3.6–11.0)

## 2013-09-18 LAB — HEMATOCRIT: HCT: 35.2 % (ref 35.0–47.0)

## 2013-09-19 LAB — PATHOLOGY REPORT

## 2013-09-30 ENCOUNTER — Encounter (HOSPITAL_COMMUNITY): Payer: Self-pay

## 2014-02-10 ENCOUNTER — Encounter (HOSPITAL_COMMUNITY): Payer: Self-pay

## 2014-02-14 ENCOUNTER — Ambulatory Visit: Payer: Medicaid Other | Admitting: Family Medicine

## 2014-02-17 ENCOUNTER — Telehealth: Payer: Self-pay | Admitting: Family Medicine

## 2014-02-17 NOTE — Telephone Encounter (Signed)
Patient did not come for their scheduled appointment 02/14/14  stomach issues/pain behind ear  Please let me know if the patient needs to be contacted immediately for follow up or if no follow up is necessary.

## 2014-02-17 NOTE — Telephone Encounter (Signed)
No, thanks 

## 2014-02-17 NOTE — Telephone Encounter (Signed)
See notes from PCP.

## 2014-02-18 ENCOUNTER — Encounter (HOSPITAL_COMMUNITY): Payer: Self-pay | Admitting: *Deleted

## 2014-02-20 ENCOUNTER — Ambulatory Visit: Payer: Medicaid Other | Admitting: Family Medicine

## 2014-03-25 ENCOUNTER — Ambulatory Visit: Payer: Medicaid Other | Admitting: Family Medicine

## 2014-03-27 ENCOUNTER — Ambulatory Visit: Payer: Medicaid Other | Admitting: Family Medicine

## 2014-03-28 ENCOUNTER — Ambulatory Visit: Payer: Medicaid Other | Admitting: Family Medicine

## 2014-04-01 ENCOUNTER — Telehealth: Payer: Self-pay | Admitting: Family Medicine

## 2014-04-01 ENCOUNTER — Ambulatory Visit: Payer: Medicaid Other | Admitting: Family Medicine

## 2014-04-01 NOTE — Telephone Encounter (Signed)
It was an acute visit, please get update on patient before making a decision about this.  Let me know.  Thanks.

## 2014-04-01 NOTE — Telephone Encounter (Signed)
Pt said her schedule has been hectic and she missed this appt because both of her children are sick, pt wants to reschedule appt until after holidays because she wants to be seen but pt is requesting that her PCP be changed from Dr. Milinda Antisower to Dr. Para Marchuncan. I advise pt that I would send the request to both doctors and we would call her back with the answer (you may want to review pt's past appt history)

## 2014-04-01 NOTE — Telephone Encounter (Signed)
Patient did not come for their scheduled appointment today for throat  Please let me know if the patient needs to be contacted immediately for follow up or if no follow up is necessary.

## 2014-04-01 NOTE — Telephone Encounter (Signed)
This is up to Dr Para Marchuncan   (I am fine with it)  Today she missed an acute appt.   (? Not as sick as she thought she was?)

## 2014-04-02 NOTE — Telephone Encounter (Signed)
I cannot justify the change in PCP.

## 2014-04-02 NOTE — Telephone Encounter (Signed)
Patient notified as instructed by telephone. Appointment scheduled with Dr. Milinda Antisower per patient's request.

## 2014-04-02 NOTE — Telephone Encounter (Signed)
See comment from Dr Para Marchuncan

## 2014-04-07 ENCOUNTER — Ambulatory Visit: Payer: Medicaid Other | Admitting: Family Medicine

## 2014-04-08 ENCOUNTER — Telehealth: Payer: Self-pay | Admitting: Family Medicine

## 2014-04-08 ENCOUNTER — Encounter: Payer: Self-pay | Admitting: Family Medicine

## 2014-04-08 IMAGING — CR DG CHOLANGIOGRAM OPERATIVE
1 series · 1 of 1 positions shown · non-contrast
Comparison: none

REASON FOR EXAM: Cholelithiasis
COMMENTS:

[s1]
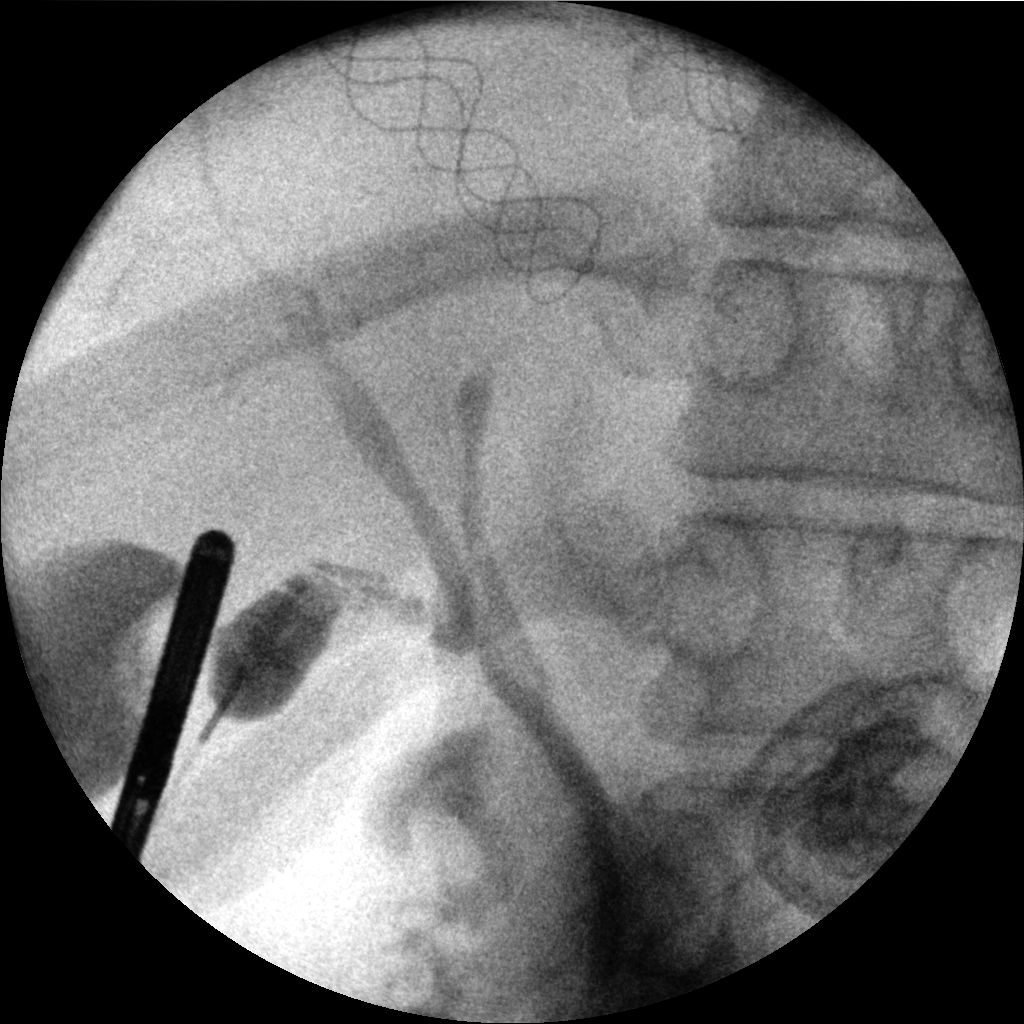

[1 of 1 positions shown; findings below may reference images not displayed]

PROCEDURE:     DXR - DXR CHOLANGIOGRAM OP (INITIAL)  - October 02, 2012  [DATE]

RESULT:     Images from an intraoperative cholangiogram show opacification
of a portion of the common bile duct common hepatic duct and cystic duct as
well as the gallbladder. There is no definite filling defect in the common
duct. Contrast is present in the duodenum.
IMPRESSION: Please see above.

[REDACTED]

## 2014-04-08 NOTE — Telephone Encounter (Addendum)
° °  Patient dismissed from [facility] by Roxy MannsMarne Tower MD , effective April 08, 2014. Dismissal letter sent out by certified / registered mail.  DAJ  Received signed domestic return receipt verifying delivery of certified letter on April 15, 2014. Article number [id number] DAJ

## 2014-08-01 NOTE — Op Note (Signed)
PATIENT NAME:  Stefanie Kelley, Stefanie M MR#:  [id number] DATE OF BIRTH:  28-Nov-1985  DATE OF PROCEDURE:  10/02/2012  PREOPERATIVE DIAGNOSIS: Chronic cholecystitis and cholelithiasis.   POSTOPERATIVE DIAGNOSIS:  Chronic cholecystitis and cholelithiasis.    OPERATION: Laparoscopy, cholecystectomy with intraoperative cholangiogram.   SURGEON: Kathreen CosierS. G. Dejanee Thibeaux, M.D.   ANESTHESIA: General.   COMPLICATIONS: None.   ESTIMATED BLOOD LOSS: Less than 25 mL.   DRAINS: None.   DESCRIPTION OF PROCEDURE: The patient was put to sleep in the supine position on the operating table. The abdomen was prepped and draped out as a sterile field. Timeout procedure was performed and a small incision was made just above the umbilicus. The Veress needle with the InnerDyne sleeve was positioned in the peritoneal cavity, verified with the hanging drop method. The 10 mm port was then positioned and the camera was introduced with good visualization of the peritoneal cavity. Epigastric and two lateral 5 mm ports were placed. The gallbladder was noted to be mildly thickened in its wall and had a little bit of adhesions surrounding the Hartmann's pouch area. With cephalad traction, the adhesions were taken down and the Hartmann's pouch was exposed, followed by the cystic duct and the cystic artery, which were both isolated. The cystic artery was hemo-clipped. Kumar clamp and catheter were positioned. The gallbladder and cholangiogram was performed, which showed good filling of the bile duct both proximal and distal with no apparent filling defects identified. The catheter was used to decompress the gallbladder slightly and removed. The cystic duct was hemo-clipped and cut. The cystic artery likewise was cut. The gallbladder was dissected free from its bed using cautery for control of bleeding. Following this, a 5 mm scope was used at the epigastric port site, and the gallbladder was placed in retrieval bag and brought out through the  umbilical port site. It was noted to have multiple stones, the largest being about a centimeter in size. The fascial opening at the umbilicus incision was closed with 0 Vicryl using a suture passer. Pneumoperitoneum was released and after suctioning out the fluids from around the liver and ports were removed. All skin incisions were closed with subcuticular 4-0 Vicryl, reinforced with Steri-Strips and dry sterile dressing was placed. The patient tolerated the procedure well with no immediate problems encountered. She was returned to the recovery room in stable condition    ____________________________ S.Wynona LunaG. Paitynn Mikus, MD sgs:cc D: 10/02/2012 14:45:42 ET T: 10/02/2012 15:45:54 ET JOB#: [id number]  cc: Timoteo ExposeS.G. Evette CristalSankar, MD, <Dictator> [facility] Wynona LunaG Hilarie Sinha MD ELECTRONICALLY SIGNED 10/05/2012 15:33

## 2014-08-02 NOTE — Op Note (Signed)
PATIENT NAME:  Stefanie Kelley, [name] MR#:  [id number] DATE OF BIRTH:  December 06, 1985  DATE OF PROCEDURE:  09/18/2013  PREOPERATIVE DIAGNOSIS: Multiparous female desiring permanent sterilization.   POSTOPERATIVE DIAGNOSIS: Female sterilized.   ESTIMATED BLOOD LOSS: 50 mL.   FINDINGS: Normal ovaries bilaterally. Normal tubes bilaterally. 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   SURGEON: Elliot Gurneyarrie C. Tanayah Squitieri, M.D.   ANESTHESIA: General endotracheal and local.   DESCRIPTION OF PROCEDURE: The patient was taken to the operating room and placed in the supine position. After adequate general endotracheal anesthesia was instilled, the patient was prepped and draped in the usual sterile fashion. Timeout was performed, and the umbilicus was injected with Marcaine. The umbilicus was grasped with a pickup, and incision was made vertically from the umbilicus to approximately 3 inches below the umbilicus. This was carried sharply down to the fascia. The fascia was grasped with a Kocher and nicked with a Metzenbaum. The Kocher was then used to grasp the peritoneum. The peritoneum was grasped and cut. S retractors were then used to hold back the peritoneum and the fascia. Deeper retractors were then obtained as the patient was a larger BMI. It was more difficult to see with the skinny S retractors. A mini lap was then used to pack back the bowel. The patient was placed in Trendelenburg. The right tube was grasped with the Babcock and carried out to the fimbriated end. This was then carried back to the mid ampullary region. An incision was made in the broad ligament with the Bovie. Two plain gut ties were then used to tie off approximately a 2 cm portion of the tube. The portion of the tube was removed and excised. The tube was allowed to fall back into the pelvis. Attention was then turned to the other tube which was grasped with a Babcock clamp, carried out to the  fimbriated end, carried back to the mid ampullary region. Incision was made in the broad ligament with the Bovie. Two pieces of plain gut suture were used to tie off approximately a 2 cm portion of the tube. This tube was excised. Good hemostasis was identified. The tube was allowed to fall back into the pelvis. The mini lap was removed. The fascia was grasped with the Allis clamp. UR-6 was used to approximate the fascia and the peritoneum in a running fashion. A subcutaneous fat suture was placed with Vicryl to approximate the subcutaneous fat and decrease tension. A 4-0 Monocryl was used to approximate the skin edges. Dermabond was placed, and the patient was placed out of Trendelenburg. Was laid flat. Good hemostasis was identified. The patient was then taken to recovery after having tolerated the procedure well.    ____________________________ Elliot Gurneyarrie C. Elven Laboy, MD cck:gb D: 09/18/2013 23:32:02 ET T: 09/18/2013 23:47:36 ET JOB#: [id number]  cc: Elliot Gurneyarrie C. Charlotta Lapaglia, MD, <Dictator> Elliot GurneyARRIE C Breah Joa MD ELECTRONICALLY SIGNED 09/30/2013 20:51

## 2014-08-19 NOTE — H&P (Signed)
L&D Evaluation:  History Expanded:  HPI 29 year old G2 P0101 with EDD of 09/26/2013 by 6wk1d ultrasound presents at 38w 5d with c/o gush of fluid at 1840 this evening with several subsequent gushes of fluid. No VB. +FM, but somewhat decreased. 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 Questionable outbreak at 36 wks, pt is taking Valtrex regularly. EFW at 34 4/7 weeks was 7#. O negative blood type, received Rhogam on 3/30. TDAP given  4/13. Hx also significant for preeclampsia with G1 requiring delivery at 26 weeks and asthma.LABS: O neg/RI/VI/GBS negative. She desires ppBTL and has signed 30 day Medicaid papers 3/30   Presents with contractions, leaking fluid   Patient's Medical History Asthma  Depression/anxiety, Obesity, Migraine   Patient's Surgical History Cholecystectomy 2014, right foot surgery 2009, T&A at age 29   Medications Pre Natal Vitamins  Valtrex 500 mg BID, Zoloft 100 mg daily, Baby ASA daily, Albuterol inhaler prn   Allergies Latex   Social History none   Family History Non-Contributory   ROS:  ROS see HPI   Exam:  Vital Signs stable   Urine Protein not completed   General no apparent distress   Mental Status clear   Chest clear   Heart no murmur/gallop/rubs   Abdomen gravid, tender with contractions, obese   Fetal Position vertex   Edema 2+   Reflexes 1+   Pelvic no external lesions, Cervix 4.5/75/-2. No herpetic lesions seen, small boil on left buttocks/panty line.   Mebranes Ruptured   Description clear   FHT normal rate with no decels, baseline 150   FHT Description mod variability    Ucx q 5 min   Skin dry   Impression:  Impression IUP at 4539w5d with SROM   Plan:  Plan monitor contractions and for cervical change   Comments IV fluids and labs now May ambulate in hallways if desired Epidural when appropriate   Electronic Signatures: Vella KohlerBrothers, Gavyn Ybarra K (CNM)  (Signed 09-Jun-15 00:18)  Authored: L&D Evaluation   Last Updated: 09-Jun-15 00:18 by Vella KohlerBrothers, Chancey Cullinane K (CNM)

## 2014-08-19 NOTE — H&P (Signed)
L&D Evaluation:  History:  HPI 29 year old G2 30P0101 with EDC=09/26/2013 by 6wk1d ultrasound presents at 38 weeks with c/o regular contractions q 4-6 min since 1900 tonight. Also c/o LOF beginning at 2200, but Nitrazine and fern test were negative. She has progressed from 2.5 to 3.5 cm over the last 4 hours and states that her contractions are intensifying. No VB. Baby active. 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 EFW at 34 4/7 weeks was 7#. She is currently finishing Macrobid for UTI. She is O negative blood type and did receive Rhogam on 3/30. TDAP given  4/13. Hx also significant for preeclampsia with G1 requiring delivery at 26 weeks and asthma.LABS: O neg/RI/VI/GBS negative. She desires ppBTL and has signed 30 day Medicaid papers 3/30   Presents with contractions, leaking fluid   Patient's Medical History Asthma  Depression/anxiety, Obesity, Migraine   Patient's Surgical History Cholecystectomy 2014, right foot surgery 2009, T&A at age 29   Medications Pre Natal Vitamins  Valtrex 500 mgm BID, Macrobid BID for recent UTI, Zoloft 100 mgm daily, Baby ASA daily, Albuterol inhaler prn   Allergies Latex   Social History none   Family History Non-Contributory   ROS:  ROS see HPI   Exam:  Vital Signs stable   Urine Protein not completed   General no apparent distress   Mental Status clear   Chest clear   Heart normal sinus rhythm, no murmur/gallop/rubs   Abdomen gravid, tender with contractions, obese   Estimated Fetal Weight 8 1/2#   Fetal Position vertex   Edema 2+   Reflexes 1+   Pelvic no external lesions, No herpetic lesions seen externally or in  vagina.  Small boil on panty line on left. CX on  arrival 2.5cm and now 3.5/60%/-1   Mebranes Intact   FHT normal rate with no decels, 135 baseline with accels to 180-Cat1   FHT Description mod variability   Ucx q-6 min   Skin dry   Impression:  Impression reactive NST, IUP at 38 weeks in early labor   Plan:  Plan EFM/NST, monitor contractions and for cervical change, Can be monitored intermittently x 15-20 min after 45 min of ambulation in early labor. Desires epidural when appropriate.   Electronic Signatures: Trinna BalloonGutierrez, Maedell Hedger L (CNM)  (Signed 04-Jun-15 04:11)  Authored: L&D Evaluation   Last Updated: 04-Jun-15 04:11 by Trinna BalloonGutierrez, Adhira Jamil L (CNM)

## 2016-04-24 ENCOUNTER — Ambulatory Visit
Admission: EM | Admit: 2016-04-24 | Discharge: 2016-04-24 | Disposition: A | Discharge status: Home / Self Care | Payer: Medicaid Other | Attending: Emergency Medicine | Admitting: Emergency Medicine

## 2016-04-24 ENCOUNTER — Encounter: Payer: Self-pay | Admitting: Gynecology

## 2016-04-24 DIAGNOSIS — N390 Urinary tract infection, site not specified: Secondary | ICD-10-CM | POA: Diagnosis not present

## 2016-04-24 DIAGNOSIS — Z7982 Long term (current) use of aspirin: Secondary | ICD-10-CM | POA: Insufficient documentation

## 2016-04-24 DIAGNOSIS — Z87891 Personal history of nicotine dependence: Secondary | ICD-10-CM | POA: Insufficient documentation

## 2016-04-24 DIAGNOSIS — Z79899 Other long term (current) drug therapy: Secondary | ICD-10-CM | POA: Insufficient documentation

## 2016-04-24 LAB — RAPID INFLUENZA A&B ANTIGENS: Influenza A (ARMC): NEGATIVE

## 2016-04-24 LAB — COMPREHENSIVE METABOLIC PANEL
ALT: 27 U/L (ref 14–54)
AST: 22 U/L (ref 15–41)
Albumin: 4.1 g/dL (ref 3.5–5.0)
Alkaline Phosphatase: 49 U/L (ref 38–126)
Anion gap: 8 (ref 5–15)
BUN: 12 mg/dL (ref 6–20)
CO2: 23 mmol/L (ref 22–32)
Calcium: 9.3 mg/dL (ref 8.9–10.3)
Chloride: 105 mmol/L (ref 101–111)
Creatinine, Ser: 0.82 mg/dL (ref 0.44–1.00)
GFR calc Af Amer: >60 mL/min (ref >60)
GFR calc non Af Amer: >60 mL/min (ref >60)
Glucose, Bld: 97 mg/dL (ref 65–99)
Potassium: 3.9 mmol/L (ref 3.5–5.1)
Sodium: 136 mmol/L (ref 135–145)
Total Bilirubin: 0.6 mg/dL (ref 0.3–1.2)
Total Protein: 7.6 g/dL (ref 6.5–8.1)

## 2016-04-24 LAB — CBC WITH DIFFERENTIAL/PLATELET
Basophils Absolute: 0.1 10*3/uL (ref 0–0.1)
Basophils Relative: 1 %
Eosinophils Absolute: 0.4 10*3/uL (ref 0–0.7)
Eosinophils Relative: 5 %
HCT: 41.8 % (ref 35.0–47.0)
Hemoglobin: 14.1 g/dL (ref 12.0–16.0)
Lymphocytes Relative: 36 %
Lymphs Abs: 3.3 10*3/uL (ref 1.0–3.6)
MCH: 29.3 pg (ref 26.0–34.0)
MCHC: 33.7 g/dL (ref 32.0–36.0)
MCV: 87.1 fL (ref 80.0–100.0)
Monocytes Absolute: 0.9 10*3/uL (ref 0.2–0.9)
Monocytes Relative: 9 %
Neutro Abs: 4.5 10*3/uL (ref 1.4–6.5)
Neutrophils Relative %: 49 %
Platelets: 243 10*3/uL (ref 150–440)
RBC: 4.8 MIL/uL (ref 3.80–5.20)
RDW: 13.4 % (ref 11.5–14.5)
WBC: 9.2 10*3/uL (ref 3.6–11.0)

## 2016-04-24 LAB — URINALYSIS, COMPLETE (UACMP) WITH MICROSCOPIC
Bilirubin Urine: NEGATIVE
GLUCOSE, UA: NEGATIVE mg/dL
Hgb urine dipstick: NEGATIVE
Ketones, ur: NEGATIVE mg/dL
NITRITE: NEGATIVE
PH: 7 (ref 5.0–8.0)
Protein, ur: NEGATIVE mg/dL
SPECIFIC GRAVITY, URINE: 1.025 (ref 1.005–1.030)

## 2016-04-24 LAB — RAPID INFLUENZA A&B ANTIGENS (ARMC ONLY): INFLUENZA B (ARMC): NEGATIVE

## 2016-04-24 MED ORDER — ONDANSETRON 8 MG PO TBDP: 8.0000 mg | ORAL_TABLET | Freq: Two times a day (BID) | ORAL | 0 refills | Status: DC

## 2016-04-24 MED ORDER — CIPROFLOXACIN HCL 500 MG PO TABS: 500.0000 mg | ORAL_TABLET | Freq: Two times a day (BID) | ORAL | 0 refills | Status: DC

## 2016-04-24 NOTE — ED Triage Notes (Signed)
Patient c/o fever at home of 102, cold chills  / headache and vomiting x 2 days ago.

## 2016-04-24 NOTE — ED Provider Notes (Signed)
CSN: [id number]     Arrival date & time 04/24/16  1529 History   First MD Initiated Contact with Patient 04/24/16 1616     Chief Complaint  Patient presents with  . Fever  . Headache  . Abdominal Pain   (Consider location/radiation/quality/duration/timing/severity/associated sxs/prior Treatment) HPI  31 year old female who presents with a 3 days history of headache fatigue nausea vomiting and diarrhea with a fever of 102 at home. She denies any urinary symptoms nor any coughing. She's had some lower abdominal discomfort. She states that she had this severe diarrhea proximally 2 days ago that had formed  Elements but there was no blood or mucus. She had an episode last night but has not had any today. She has not any vomiting today but has been nauseous. Today her temperature is 99.5 pulse 85 blood pressure 112/78 respirations 16 and O2 saturations on room air 99%.     Past Medical History:  Diagnosis Date  . Abdominal pain   . Abnormal Pap smear   . Depression    on meds, doing ok  . Gallstones   . GERD (gastroesophageal reflux disease)   . Kidney stones   . Obesity   . Pregnancy induced hypertension   . Preterm labor    due to [facility]   Past Surgical History:  Procedure Laterality Date  . CHOLECYSTECTOMY  2014  . FOOT SURGERY    . TONSILLECTOMY     Family History  Problem Relation Age of Onset  . Diabetes Brother   . Diabetes Maternal Grandmother   . Heart disease Maternal Grandfather   . Hypertension Maternal Grandfather    Social History  Substance Use Topics  . Smoking status: Former Games developer  . Smokeless tobacco: Never Used     Comment: Quit 11/2011  . Alcohol use Yes     Comment: occ, none with preg   OB History    Gravida Para Term Preterm AB Living   2 1       1    SAB TAB Ectopic Multiple Live Births                  Obstetric Comments   1st Menstrual Cycle: 10 1st Pregnancy:  16     Review of Systems  Constitutional: Positive for activity change,  appetite change, chills, fatigue and fever.  Gastrointestinal: Positive for abdominal pain, diarrhea, nausea and vomiting. Negative for abdominal distention and blood in stool.  Genitourinary: Negative for difficulty urinating, dysuria, frequency, urgency and vaginal discharge.  All other systems reviewed and are negative.   Allergies  Latex  Home Medications   Prior to Admission medications   Medication Sig Start Date End Date Taking? Authorizing Provider  acetaminophen (TYLENOL) 325 MG tablet Take 650 mg by mouth every 6 (six) hours as needed for headache.   Yes Historical Provider, MD  albuterol (PROVENTIL HFA;VENTOLIN HFA) 108 (90 BASE) MCG/ACT inhaler Inhale 2 puffs into the lungs every 6 (six) hours as needed. wheezing   Yes Historical Provider, MD  sertraline (ZOLOFT) 50 MG tablet Take 50 mg by mouth daily.   Yes Historical Provider, MD  aspirin 81 MG tablet Take 81 mg by mouth daily.    Historical Provider, MD  ciprofloxacin (CIPRO) 500 MG tablet Take 1 tablet (500 mg total) by mouth every 12 (twelve) hours. 04/24/16   Lutricia Feil, PA-C  ondansetron (ZOFRAN ODT) 8 MG disintegrating tablet Take 1 tablet (8 mg total) by mouth 2 (two) times daily. Take  for nausea as necessary 04/24/16   Lutricia Feil, PA-C  Prenatal Vit-Fe Fumarate-FA (PRENATAL MULTIVITAMIN) TABS tablet Take 1 tablet by mouth daily at 12 noon.    Historical Provider, MD   Meds Ordered and Administered this Visit  Medications - No data to display  BP 112/78 (BP Location: Left Arm)   Pulse 85   Temp 99.5 F (37.5 C) (Oral)   Resp 16   Ht 5\' 6"  (1.676 m)   Wt 260 lb (117.9 kg)   LMP 03/27/2016   SpO2 99%   BMI 41.97 kg/m  No data found.   Physical Exam  Constitutional: She is oriented to person, place, and time. She appears well-developed and well-nourished. No distress.  HENT:  Head: Normocephalic and atraumatic.  Right Ear: External ear normal.  Left Ear: External ear normal.  Nose: Nose normal.   Mouth/Throat: Oropharynx is clear and moist. No oropharyngeal exudate.  Eyes: EOM are normal. Pupils are equal, round, and reactive to light. Right eye exhibits no discharge. Left eye exhibits no discharge.  Neck: Normal range of motion. Neck supple.  Pulmonary/Chest: Effort normal and breath sounds normal. No respiratory distress. She has no wheezes. She has no rales.  Abdominal: Soft.  Was performed in the presence of Betsy, Charity fundraiser as Nurse, children's. Patient is obese. Bowel sounds are hypotonic. There is no guarding and no rebound present. She does have some diffuse tenderness periumbilical and into the lower quadrants bilateral. There is no CVA tenderness.  Musculoskeletal: Normal range of motion.  Lymphadenopathy:    She has no cervical adenopathy.  Neurological: She is alert and oriented to person, place, and time.  Skin: Skin is warm and dry. She is not diaphoretic.  Psychiatric: She has a normal mood and affect. Her behavior is normal. Judgment and thought content normal.  Nursing note and vitals reviewed.   Urgent Care Course   Clinical Course     Procedures (including critical care time)  Labs Review Labs Reviewed  URINALYSIS, COMPLETE (UACMP) WITH MICROSCOPIC - Abnormal; Notable for the following:       Result Value   APPearance HAZY (*)    Leukocytes, UA SMALL (*)    Squamous Epithelial / LPF 6-30 (*)    Bacteria, UA MANY (*)    All other components within normal limits  RAPID INFLUENZA A&B ANTIGENS ([facility] ONLY)  URINE CULTURE  COMPREHENSIVE METABOLIC PANEL  CBC WITH DIFFERENTIAL/PLATELET    Imaging Review No results found.   Visual Acuity Review  Right Eye Distance:   Left Eye Distance:   Bilateral Distance:    Right Eye Near:   Left Eye Near:    Bilateral Near:         MDM   1. Lower urinary tract infectious disease    Discharge Medication List as of 04/24/2016  5:11 PM    START taking these medications   Details  ciprofloxacin (CIPRO)  500 MG tablet Take 1 tablet (500 mg total) by mouth every 12 (twelve) hours., Starting Sun 04/24/2016, Print    ondansetron (ZOFRAN ODT) 8 MG disintegrating tablet Take 1 tablet (8 mg total) by mouth 2 (two) times daily. Take for nausea as necessary, Starting Sun 04/24/2016, Print      Plan: 1. Test/x-ray results and diagnosis reviewed with patient 2. rx as per orders; risks, benefits, potential side effects reviewed with patient 3. Recommend supportive treatment with Fluids. Cultures and sensitivities from the urine should be available in 48 hours. I suspect  however this may show mixed flora. I will treat her for suspected UTI but may also have a viral gastroenteritis. She is given Zofran for nausea. I've encouraged to increase fluids. She'll follow-up with her primary care physician next week if she is not improving. 4. F/u prn if symptoms worsen or don't improve     Lutricia FeilWilliam P Annaliyah Willig, PA-C 04/24/16 1715

## 2016-04-26 LAB — URINE CULTURE: Culture: >=100000 — AB

## 2016-04-29 ENCOUNTER — Telehealth: Payer: Self-pay | Admitting: Emergency Medicine

## 2016-04-30 MED ORDER — AMOXICILLIN-POT CLAVULANATE 875-125 MG PO TABS
1.0000 | ORAL_TABLET | Freq: Two times a day (BID) | ORAL | 0 refills | Status: AC
Start: 2016-04-30 — End: 2016-05-03

## 2016-04-30 NOTE — Telephone Encounter (Signed)
-----   Message from Lutricia FeilWilliam P Roemer, PA-C sent at 04/26/2016  8:50 PM EST ----- Please contact the patient to see how she is doing. Tell her that she did grow Lactobacillus is in her urine. We should call in Augmentin [phone] twice a day for 3 days. She'll follow-up with her primary care is not improving

## 2016-04-30 NOTE — Telephone Encounter (Signed)
Left a second message for patient to call us back regarding her urine culture result with her mother.  Joanne GavelBill Romer, PA reviewed results ant would like Augmentin prescription sent to patient's pharmacy.  Mother states [facility] [place].  [facility] [place].

## 2016-05-02 ENCOUNTER — Telehealth: Payer: Self-pay

## 2016-05-02 NOTE — Telephone Encounter (Signed)
Spoke to patient and she is aware of results and the need to pick up new antibiotic to start taking.

## 2019-03-11 ENCOUNTER — Other Ambulatory Visit: Payer: Self-pay

## 2019-03-11 DIAGNOSIS — Z20822 Contact with and (suspected) exposure to covid-19: Secondary | ICD-10-CM

## 2019-03-12 LAB — NOVEL CORONAVIRUS, NAA: SARS-CoV-2, NAA: NOT DETECTED

## 2019-04-11 ENCOUNTER — Ambulatory Visit: Payer: Self-pay | Attending: Internal Medicine

## 2019-04-11 DIAGNOSIS — Z20822 Contact with and (suspected) exposure to covid-19: Secondary | ICD-10-CM

## 2019-04-11 DIAGNOSIS — Z20828 Contact with and (suspected) exposure to other viral communicable diseases: Secondary | ICD-10-CM | POA: Insufficient documentation

## 2019-04-13 ENCOUNTER — Telehealth: Payer: Self-pay

## 2019-04-13 NOTE — Telephone Encounter (Signed)
Pt  called for covid results -advised results are not back.  

## 2019-04-15 ENCOUNTER — Telehealth: Payer: Self-pay

## 2019-04-15 NOTE — Telephone Encounter (Signed)
Caller advise result not back yet 

## 2019-04-17 ENCOUNTER — Ambulatory Visit: Payer: Self-pay | Attending: Internal Medicine

## 2019-04-17 DIAGNOSIS — Z20822 Contact with and (suspected) exposure to covid-19: Secondary | ICD-10-CM | POA: Insufficient documentation

## 2019-04-17 LAB — NOVEL CORONAVIRUS, NAA

## 2019-04-19 LAB — NOVEL CORONAVIRUS, NAA: SARS-CoV-2, NAA: NOT DETECTED

## 2020-07-03 ENCOUNTER — Emergency Department (HOSPITAL_COMMUNITY): Payer: No Typology Code available for payment source

## 2020-07-03 ENCOUNTER — Emergency Department (HOSPITAL_COMMUNITY)
Admission: EM | Admit: 2020-07-03 | Discharge: 2020-07-03 | Disposition: A | Discharge status: Home / Self Care | Payer: No Typology Code available for payment source | Attending: Emergency Medicine | Admitting: Emergency Medicine

## 2020-07-03 ENCOUNTER — Encounter (HOSPITAL_COMMUNITY): Payer: Self-pay

## 2020-07-03 ENCOUNTER — Other Ambulatory Visit: Payer: Self-pay

## 2020-07-03 DIAGNOSIS — J45909 Unspecified asthma, uncomplicated: Secondary | ICD-10-CM | POA: Diagnosis not present

## 2020-07-03 DIAGNOSIS — R911 Solitary pulmonary nodule: Secondary | ICD-10-CM | POA: Insufficient documentation

## 2020-07-03 DIAGNOSIS — Z87891 Personal history of nicotine dependence: Secondary | ICD-10-CM | POA: Insufficient documentation

## 2020-07-03 DIAGNOSIS — K219 Gastro-esophageal reflux disease without esophagitis: Secondary | ICD-10-CM | POA: Insufficient documentation

## 2020-07-03 DIAGNOSIS — N281 Cyst of kidney, acquired: Secondary | ICD-10-CM | POA: Insufficient documentation

## 2020-07-03 DIAGNOSIS — Z7982 Long term (current) use of aspirin: Secondary | ICD-10-CM | POA: Diagnosis not present

## 2020-07-03 DIAGNOSIS — N200 Calculus of kidney: Secondary | ICD-10-CM | POA: Diagnosis not present

## 2020-07-03 DIAGNOSIS — R1031 Right lower quadrant pain: Secondary | ICD-10-CM | POA: Diagnosis present

## 2020-07-03 DIAGNOSIS — Z91040 Latex allergy status: Secondary | ICD-10-CM | POA: Insufficient documentation

## 2020-07-03 LAB — CBC
HCT: 40 % (ref 36.0–46.0)
Hemoglobin: 13.7 g/dL (ref 12.0–15.0)
MCH: 30.6 pg (ref 26.0–34.0)
MCHC: 34.3 g/dL (ref 30.0–36.0)
MCV: 89.3 fL (ref 80.0–100.0)
Platelets: 257 10*3/uL (ref 150–400)
RBC: 4.48 MIL/uL (ref 3.87–5.11)
RDW: 13 % (ref 11.5–15.5)
WBC: 11.1 10*3/uL — ABNORMAL HIGH (ref 4.0–10.5)
nRBC: 0 % (ref 0.0–0.2)

## 2020-07-03 LAB — COMPREHENSIVE METABOLIC PANEL
ALT: 41 U/L (ref 0–44)
AST: 25 U/L (ref 15–41)
Albumin: 3.9 g/dL (ref 3.5–5.0)
Alkaline Phosphatase: 50 U/L (ref 38–126)
Anion gap: 8 (ref 5–15)
BUN: 13 mg/dL (ref 6–20)
CO2: 23 mmol/L (ref 22–32)
Calcium: 9.3 mg/dL (ref 8.9–10.3)
Chloride: 107 mmol/L (ref 98–111)
Creatinine, Ser: 0.95 mg/dL (ref 0.44–1.00)
GFR, Estimated: >60 mL/min (ref >60)
Glucose, Bld: 114 mg/dL — ABNORMAL HIGH (ref 70–99)
Potassium: 4.2 mmol/L (ref 3.5–5.1)
Sodium: 138 mmol/L (ref 135–145)
Total Bilirubin: 1 mg/dL (ref 0.3–1.2)
Total Protein: 7.3 g/dL (ref 6.5–8.1)

## 2020-07-03 LAB — URINALYSIS, ROUTINE W REFLEX MICROSCOPIC
Bacteria, UA: NONE SEEN
Bilirubin Urine: NEGATIVE
Glucose, UA: NEGATIVE mg/dL
Ketones, ur: NEGATIVE mg/dL
Nitrite: NEGATIVE
Protein, ur: 30 mg/dL — AB
RBC / HPF: >50 RBC/hpf — ABNORMAL HIGH (ref 0–5)
Specific Gravity, Urine: 1.019 (ref 1.005–1.030)
pH: 5 (ref 5.0–8.0)

## 2020-07-03 LAB — LIPASE, BLOOD: Lipase: 30 U/L (ref 11–51)

## 2020-07-03 LAB — I-STAT BETA HCG BLOOD, ED (MC, WL, AP ONLY): I-stat hCG, quantitative: <5 m[IU]/mL (ref <5)

## 2020-07-03 MED ORDER — KETOROLAC TROMETHAMINE 60 MG/2ML IM SOLN
60.0000 mg | Freq: Once | INTRAMUSCULAR | Status: AC
  Administered 2020-07-03: 60 mg via INTRAMUSCULAR
  Filled 2020-07-03: qty 2

## 2020-07-03 MED ORDER — ONDANSETRON 4 MG PO TBDP: 4.0000 mg | ORAL_TABLET | Freq: Three times a day (TID) | ORAL | 0 refills | Status: DC | PRN

## 2020-07-03 MED ORDER — SODIUM CHLORIDE 0.9 % IV BOLUS
500.0000 mL | Freq: Once | INTRAVENOUS | Status: AC
Start: 2020-07-03 — End: 2020-07-03
  Administered 2020-07-03: 500 mL via INTRAVENOUS

## 2020-07-03 MED ORDER — ONDANSETRON 8 MG PO TBDP
8.0000 mg | ORAL_TABLET | Freq: Once | ORAL | Status: AC
  Administered 2020-07-03: 8 mg via ORAL
  Filled 2020-07-03: qty 1

## 2020-07-03 MED ORDER — TAMSULOSIN HCL 0.4 MG PO CAPS: 0.4000 mg | ORAL_CAPSULE | Freq: Every day | ORAL | 0 refills | Status: DC

## 2020-07-03 MED ORDER — IBUPROFEN 800 MG PO TABS: 800.0000 mg | ORAL_TABLET | Freq: Three times a day (TID) | ORAL | 0 refills | Status: DC

## 2020-07-03 MED ORDER — MORPHINE SULFATE (PF) 4 MG/ML IV SOLN
4.0000 mg | Freq: Once | INTRAVENOUS | Status: AC
  Administered 2020-07-03: 4 mg via INTRAVENOUS
  Filled 2020-07-03: qty 1

## 2020-07-03 MED ORDER — MORPHINE SULFATE (PF) 2 MG/ML IV SOLN
2.0000 mg | Freq: Once | INTRAVENOUS | Status: AC
  Administered 2020-07-03: 2 mg via INTRAVENOUS
  Filled 2020-07-03: qty 1

## 2020-07-03 MED ORDER — IOHEXOL 300 MG/ML  SOLN
100.0000 mL | Freq: Once | INTRAMUSCULAR | Status: AC | PRN
  Administered 2020-07-03: 100 mL via INTRAVENOUS

## 2020-07-03 NOTE — Discharge Instructions (Addendum)
Like we discussed, I am going to prescribe you 3 medications.  First medication is 800 mg ibuprofen.  Please take this up to 3 times a day for your abdominal pain.  Also prescribing you Flomax.  You can take this once in the morning.  This will help increase your urinary frequency and hopefully help you pass the kidney stone faster.  The last medication is called Zofran.  You can take this up to 3 times a day for your nausea.  I have given you a referral to a local urologist.  Please give them a call and schedule a follow-up appointment since you seem to be having recurrent kidney stones.  Please make sure you follow-up with your regular doctor regarding the new pulmonary nodule on your right lung.  It is important that you get a follow-up CT scan in 12 months to reassess this.  In the meantime, I would recommend that you work on quitting smoking.  I have attached information as well regarding this.  If you develop worsening symptoms like pain with urination, fevers, chills, worsening nausea and vomiting, please return to the emergency department for reevaluation.  It was a pleasure to meet you.

## 2020-07-03 NOTE — ED Triage Notes (Signed)
Patient c/o RLQ pain and nausea since last night.

## 2020-07-03 NOTE — ED Provider Notes (Signed)
[facility]-EMERGENCY DEPT Provider Note   CSN: [id number] Arrival date & time: 07/03/20  0944     History Chief Complaint  Patient presents with  . Abdominal Pain    Stefanie Kelley is a 35 y.o. female.  HPI   Patient is a 34 year old female with a medical history as noted below.  She presents the emergency department today due to right lower quadrant pain that started last night.  Patient states her pain started suddenly and has been constant.  Her current pain is about 7/10 but at its worst has been 10/10.  She reports associated nausea when her pain worsens.  No vomiting.  No diarrhea.  She reports some mild dysuria that started this morning but no hematuria.  No vaginal discharge.  Reports a surgical history to the abdomen including tubal ligation as well as cholecystectomy.  No fevers, chills, chest pain, shortness of breath, URI symptoms.     Past Medical History:  Diagnosis Date  . Abdominal pain   . Abnormal Pap smear   . Depression    on meds, doing ok  . Gallstones   . GERD (gastroesophageal reflux disease)   . Kidney stones   . Obesity   . Pregnancy induced hypertension   . Preterm labor    due to [facility]    Patient Active Problem List   Diagnosis Date Noted  . Pregnancy 01/31/2013  . AGE (acute gastroenteritis) 01/31/2013  . Gallstones 09/25/2012  . Calculus of gallbladder with other cholecystitis, without mention of obstruction 09/25/2012  . Abdominal pain   . Cholelithiasis 09/18/2012  . Abdominal pain, unspecified site 09/17/2012  . History of gallstones 09/17/2012  . Viral gastroenteritis 04/17/2012  . Screening for STD (sexually transmitted disease) 03/16/2012  . HEMATOCHEZIA 05/24/2010  . ABNORMAL PAP SMEAR, LGSIL 05/14/2010  . MIGRAINE HEADACHE 08/07/2009  . ASTHMA, UNSPECIFIED, UNSPECIFIED STATUS 08/07/2009  . CHOLELITHIASIS, ASYMPTOMATIC 08/07/2009  . PRE-ECLAMPSIA 08/07/2009  . UTI'S, HX OF 08/07/2009  . CHICKENPOX, HX OF  08/07/2009    Past Surgical History:  Procedure Laterality Date  . CHOLECYSTECTOMY  2014  . FOOT SURGERY    . TONSILLECTOMY       OB History    Gravida  2   Para  1   Term      Preterm      AB      Living  1     SAB      IAB      Ectopic      Multiple      Live Births           Obstetric Comments  1st Menstrual Cycle: 10 1st Pregnancy:  27        Family History  Problem Relation Age of Onset  . Diabetes Brother   . Diabetes Maternal Grandmother   . Heart disease Maternal Grandfather   . Hypertension Maternal Grandfather   . Thyroid disease Mother   . Hashimoto's thyroiditis Mother     Social History   Tobacco Use  . Smoking status: Former Games developer  . Smokeless tobacco: Never Used  . Tobacco comment: Quit 11/2011  Vaping Use  . Vaping Use: Some days  . Substances: Nicotine, Flavoring  Substance Use Topics  . Alcohol use: Yes    Comment: occ, none with preg  . Drug use: No    Home Medications Prior to Admission medications   Medication Sig Start Date End Date Taking? Authorizing Provider  Aspirin-Acetaminophen-Caffeine (  GOODY HEADACHE PO) Take 1 packet by mouth every 6 (six) hours as needed (headache/pain).   Yes [provider]  ibuprofen (ADVIL) 800 MG tablet Take 1 tablet (800 mg total) by mouth 3 (three) times daily. 07/03/20  Yes Placido Sou, PA-C  ondansetron (ZOFRAN ODT) 4 MG disintegrating tablet Take 1 tablet (4 mg total) by mouth every 8 (eight) hours as needed for nausea or vomiting. 07/03/20  Yes Placido Sou, PA-C  sertraline (ZOLOFT) 100 MG tablet Take 100 mg by mouth 2 (two) times daily.   Yes [provider]  tamsulosin (FLOMAX) 0.4 MG CAPS capsule Take 1 capsule (0.4 mg total) by mouth daily after breakfast. 07/03/20  Yes Placido Sou, PA-C    Allergies    Latex and Nitrofurantoin  Review of Systems   Review of Systems  All other systems reviewed and are negative. Ten systems reviewed and are  negative for acute change, except as noted in the HPI.   Physical Exam Updated Vital Signs BP (!) 145/78   Pulse 78   Temp 98.3 F (36.8 C) (Oral)   Resp 18   Ht 5\' 6"  (1.676 m)   Wt 108.9 kg   LMP 06/05/2020   SpO2 99%   BMI 38.74 kg/m   Physical Exam Vitals and nursing note reviewed.  Constitutional:      General: She is not in acute distress.    Appearance: Normal appearance. She is well-developed. She is not ill-appearing, toxic-appearing or diaphoretic.  HENT:     Head: Normocephalic and atraumatic.     Right Ear: External ear normal.     Left Ear: External ear normal.     Nose: Nose normal.     Mouth/Throat:     Mouth: Mucous membranes are moist.     Pharynx: Oropharynx is clear. No oropharyngeal exudate or posterior oropharyngeal erythema.  Eyes:     Extraocular Movements: Extraocular movements intact.  Cardiovascular:     Rate and Rhythm: Normal rate and regular rhythm.     Pulses: Normal pulses.     Heart sounds: Normal heart sounds. No murmur heard. No friction rub. No gallop.   Pulmonary:     Effort: Pulmonary effort is normal. No respiratory distress.     Breath sounds: Normal breath sounds. No stridor. No wheezing, rhonchi or rales.  Abdominal:     General: Abdomen is flat and protuberant.     Palpations: Abdomen is soft.     Tenderness: There is abdominal tenderness in the right lower quadrant. There is no right CVA tenderness, left CVA tenderness, guarding or rebound. Positive signs include McBurney's sign and obturator sign. Negative signs include Murphy's sign and Rovsing's sign.  Musculoskeletal:        General: Normal range of motion.     Cervical back: Normal range of motion and neck supple. No tenderness.  Skin:    General: Skin is warm and dry.  Neurological:     General: No focal deficit present.     Mental Status: She is alert and oriented to person, place, and time.  Psychiatric:        Mood and Affect: Mood normal.        Behavior:  Behavior normal.    ED Results / Procedures / Treatments   Labs (all labs ordered are listed, but only abnormal results are displayed) Labs Reviewed  COMPREHENSIVE METABOLIC PANEL - Abnormal; Notable for the following components:      Result Value   Glucose, Bld  114 (*)    All other components within normal limits  CBC - Abnormal; Notable for the following components:   WBC 11.1 (*)    All other components within normal limits  URINALYSIS, ROUTINE W REFLEX MICROSCOPIC - Abnormal; Notable for the following components:   APPearance HAZY (*)    Hgb urine dipstick LARGE (*)    Protein, ur 30 (*)    Leukocytes,Ua TRACE (*)    RBC / HPF >50 (*)    All other components within normal limits  LIPASE, BLOOD  I-STAT BETA HCG BLOOD, ED ([facility], [facility], [facility] ONLY)   EKG None  Radiology CT ABDOMEN PELVIS W CONTRAST  Result Date: 07/03/2020 CLINICAL DATA:  Right lower quadrant pain and nausea. EXAM: CT ABDOMEN AND PELVIS WITH CONTRAST TECHNIQUE: Multidetector CT imaging of the abdomen and pelvis was performed using the standard protocol following bolus administration of intravenous contrast. CONTRAST:  100mL OMNIPAQUE IOHEXOL 300 MG/ML  SOLN COMPARISON:  03/31/2012 FINDINGS: Lower chest: 5 mm right lower lobe pulmonary nodule (45/6) is new in the interval. Hepatobiliary: No suspicious focal abnormality within the liver parenchyma. Gallbladder is surgically absent. No intrahepatic or extrahepatic biliary dilation. Pancreas: No focal mass lesion. No dilatation of the main duct. No intraparenchymal cyst. No peripancreatic edema. Spleen: No splenomegaly. No focal mass lesion. Adrenals/Urinary Tract: No adrenal nodule or mass. Mild right hydroureteronephrosis is associated with right renal edema and decreased perfusion to the right kidney. Findings consistent with obstructive uropathy due to the presence of a 3 mm right UVJ stone (axial image 89/series 2). 19 mm water density lesion posterior interpolar right kidney  is compatible with a cyst. 3 mm nonobstructing stone noted lower pole left kidney. Left ureter unremarkable. No evidence for stones free in the bladder lumen. Stomach/Bowel: Stomach is unremarkable. No gastric wall thickening. No evidence of outlet obstruction. Duodenum is normally positioned as is the ligament of Treitz. No small bowel wall thickening. No small bowel dilatation. Insert normal terminal in The appendix is normal. No gross colonic mass. No colonic wall thickening. Vascular/Lymphatic: No abdominal aortic aneurysm. No abdominal aortic atherosclerotic calcification. There is no gastrohepatic or hepatoduodenal ligament lymphadenopathy. No retroperitoneal or mesenteric lymphadenopathy. No pelvic sidewall lymphadenopathy. Reproductive: Exophytic fibroid noted left uterine fundus. There is no adnexal mass. Other: No intraperitoneal free fluid. Musculoskeletal: No worrisome lytic or sclerotic osseous abnormality. IMPRESSION: 1. 3 mm right UVJ stone causes mild right hydroureteronephrosis with findings of obstructive uropathy. 2. 3 mm nonobstructing stone lower pole left kidney. 3. 19 mm right renal cyst. 4. 5 mm right lower lobe pulmonary nodule, new in the interval. No follow-up needed if patient is low-risk. Non-contrast chest CT can be considered in 12 months if patient is high-risk. This recommendation follows the consensus statement: Guidelines for Management of Incidental Pulmonary Nodules Detected on CT Images: From the Fleischner Society 2017; Radiology 2017; 284:228-243. Electronically Signed   By: Kennith CenterEric  Mansell M.D.   On: 07/03/2020 13:24   Procedures Procedures   Medications Ordered in ED Medications  sodium chloride 0.9 % bolus 500 mL (0 mLs Intravenous Stopped 07/03/20 1346)  ondansetron (ZOFRAN-ODT) disintegrating tablet 8 mg (8 mg Oral Given 07/03/20 1127)  morphine 4 MG/ML injection 4 mg (4 mg Intravenous Given 07/03/20 1128)  iohexol (OMNIPAQUE) 300 MG/ML solution 100 mL (100 mLs  Intravenous Contrast Given 07/03/20 1245)  morphine 2 MG/ML injection 2 mg (2 mg Intravenous Given 07/03/20 1311)  ketorolac (TORADOL) injection 60 mg (60 mg Intramuscular Given 07/03/20 1425)  ED Course  I have reviewed the triage vital signs and the nursing notes.  Pertinent labs & imaging results that were available during my care of the patient were reviewed by me and considered in my medical decision making (see chart for details).    MDM Rules/Calculators/A&P                          Pt is a 35 y.o. female who presents the emergency department due to right lower quadrant pain as well as nausea that started last night.  Labs: CBC with a white count of 11.1. CMP with a glucose of 114. UA showing large hemoglobin, 30 protein, trace leukocytes, greater than 50 RBCs.  No bacteria. I-STAT beta-hCG less than 5.  Imaging: CT scan of the abdomen and pelvis shows a 3 mm right UVJ stone which is causing mild right hydroureteronephrosis with findings of obstructive uropathy.  There is also a 3 mm nonobstructing stone in the lower pole the left kidney.  There is a 19 mm right renal cyst.  There is a 5 mm right lower lobe pulmonary nodule.  I, Placido Sou, PA-C, personally reviewed and evaluated these images and lab results as part of my medical decision-making.  Discussed findings of the imaging with the patient in length.  We discussed both her renal cyst as well as the new pulmonary nodule.  She has a smoker.  We discussed smoking cessation at length.  She is going to follow-up with her regular doctor and be sure that she has a follow-up CT scan of the pulmonary nodule in 12 months.  Patient given a dose of Toradol in addition to IV morphine in the emergency department.  She notes complete resolution of her pain.  Will discharge on additional ibuprofen, Flomax, as well as Zofran.  We will give a referral to urology.  We discussed return precautions in length.  Feel that she is stable for  discharge and she is agreeable.  Her questions were answered and she was amicable at the time of discharge.  Note: Portions of this report may have been transcribed using voice recognition software. Every effort was made to ensure accuracy; however, inadvertent computerized transcription errors may be present.   Final Clinical Impression(s) / ED Diagnoses Final diagnoses:  Kidney stone  Pulmonary nodule  Renal cyst   Rx / DC Orders ED Discharge Orders         Ordered    tamsulosin (FLOMAX) 0.4 MG CAPS capsule  Daily after breakfast        07/03/20 1453    ibuprofen (ADVIL) 800 MG tablet  3 times daily        07/03/20 1453    ondansetron (ZOFRAN ODT) 4 MG disintegrating tablet  Every 8 hours PRN        07/03/20 1453           Placido Sou, PA-C 07/03/20 1457    Bethann Berkshire, MD 07/04/20 [phone]

## 2021-06-18 ENCOUNTER — Other Ambulatory Visit: Payer: Self-pay | Admitting: Internal Medicine

## 2021-06-18 DIAGNOSIS — Z1231 Encounter for screening mammogram for malignant neoplasm of breast: Secondary | ICD-10-CM

## 2021-06-18 DIAGNOSIS — R1084 Generalized abdominal pain: Secondary | ICD-10-CM

## 2021-07-27 ENCOUNTER — Ambulatory Visit
Admission: RE | Admit: 2021-07-27 | Discharge: 2021-07-27 | Disposition: A | Discharge status: Home / Self Care | Payer: No Typology Code available for payment source | Source: Ambulatory Visit | Attending: Internal Medicine | Admitting: Internal Medicine

## 2021-07-27 DIAGNOSIS — Z1231 Encounter for screening mammogram for malignant neoplasm of breast: Secondary | ICD-10-CM | POA: Diagnosis present

## 2021-07-29 ENCOUNTER — Other Ambulatory Visit: Payer: Self-pay | Admitting: *Deleted

## 2021-07-29 ENCOUNTER — Inpatient Hospital Stay
Admission: RE | Admit: 2021-07-29 | Discharge: 2021-07-29 | Disposition: A | Discharge status: Home / Self Care | Payer: Self-pay | Source: Ambulatory Visit | Attending: *Deleted | Admitting: *Deleted

## 2021-07-29 DIAGNOSIS — Z1231 Encounter for screening mammogram for malignant neoplasm of breast: Secondary | ICD-10-CM

## 2022-05-13 ENCOUNTER — Other Ambulatory Visit: Payer: Self-pay | Admitting: Internal Medicine

## 2022-05-13 ENCOUNTER — Ambulatory Visit
Admission: RE | Admit: 2022-05-13 | Discharge: 2022-05-13 | Disposition: A | Discharge status: Home / Self Care | Payer: No Typology Code available for payment source | Source: Ambulatory Visit | Attending: Internal Medicine | Admitting: Internal Medicine

## 2022-05-13 DIAGNOSIS — R109 Unspecified abdominal pain: Secondary | ICD-10-CM

## 2022-05-13 DIAGNOSIS — R911 Solitary pulmonary nodule: Secondary | ICD-10-CM

## 2022-05-25 ENCOUNTER — Ambulatory Visit
Admission: RE | Admit: 2022-05-25 | Discharge: 2022-05-25 | Disposition: A | Discharge status: Home / Self Care | Payer: No Typology Code available for payment source | Source: Ambulatory Visit | Attending: Internal Medicine | Admitting: Internal Medicine

## 2022-05-25 DIAGNOSIS — R911 Solitary pulmonary nodule: Secondary | ICD-10-CM | POA: Insufficient documentation

## 2022-11-27 ENCOUNTER — Ambulatory Visit
Admission: EM | Admit: 2022-11-27 | Discharge: 2022-11-27 | Disposition: A | Discharge status: Home / Self Care | Payer: No Typology Code available for payment source

## 2022-11-27 DIAGNOSIS — R079 Chest pain, unspecified: Secondary | ICD-10-CM

## 2022-11-27 NOTE — ED Provider Notes (Signed)
Patient presents for evaluation of centralized chest pain described as a tightness and achiness beginning 3 days ago, radiates into the center of the back and down the bilateral upper extremities.  Pain has been constant.  Has a clamminess to the bilateral hands.  Experiencing a mild shortness of breath at rest.  Over the past month, has been having intermittent palpitations described as a fluttering sensation with tachycardia occurring for a few seconds before spontaneous resolution.  Denies presence of dizziness, lightheadedness, headache, weakness, numbness or tingling.  Denies personal cardiac history, has family cardiac history of MI to a grandparent.  History of type 2 diabetes.  Has been experiencing heartburn and indigestion over the past 7 days using over-the-counter medications which have been ineffective.   EKG showing normal sinus rhythm, vital signs are stable.  Patient to escort self to the nearest emergency department for full cardiac workup.    Valinda Hoar, NP 11/27/22 1223

## 2022-11-27 NOTE — ED Triage Notes (Signed)
Pt states she is having chest pain in middle chest with pain radiating to shoulders and arms that started Friday. Pt states she was having heart burn that started a week ago and tried OTC medication with no relief. Pt states she is having heart palpations/flutters that comes and goes that started a month ago.

## 2022-11-27 NOTE — ED Notes (Signed)
Patient is being discharged from the Urgent Care and sent to the Emergency Department via [facility] . Per provider Dola Argyle., patient is in need of higher level of care due to chest pain, needing cardiac workup. Patient is aware and verbalizes understanding of plan of care.  Vitals:   11/27/22 1204  BP: 129/79  Pulse: 75  Resp: 18  Temp: 98.5 F (36.9 C)  SpO2: 98%

## 2022-11-27 NOTE — Discharge Instructions (Signed)
EKG shows heart is beating in a normal pace and rhythm at this moment therefore you may take yourself to the nearest emergency department to receive full cardiac workup for further evaluation to determine cause of chest pain

## 2022-11-28 ENCOUNTER — Ambulatory Visit: Payer: No Typology Code available for payment source

## 2023-01-31 IMAGING — MG MM DIGITAL SCREENING BILAT W/ TOMO AND CAD
8 of 16 series · 8 of 40 positions shown · non-contrast
Comparison: Previous exam(s).

CLINICAL DATA: Screening.

EXAM:
DIGITAL SCREENING BILATERAL MAMMOGRAM WITH TOMOSYNTHESIS AND CAD
TECHNIQUE: Bilateral screening digital craniocaudal and mediolateral oblique
mammograms were obtained. Bilateral screening digital breast
tomosynthesis was performed. The images were evaluated with
computer-aided detection.

[L MLO synth-2D (1 of 2)]
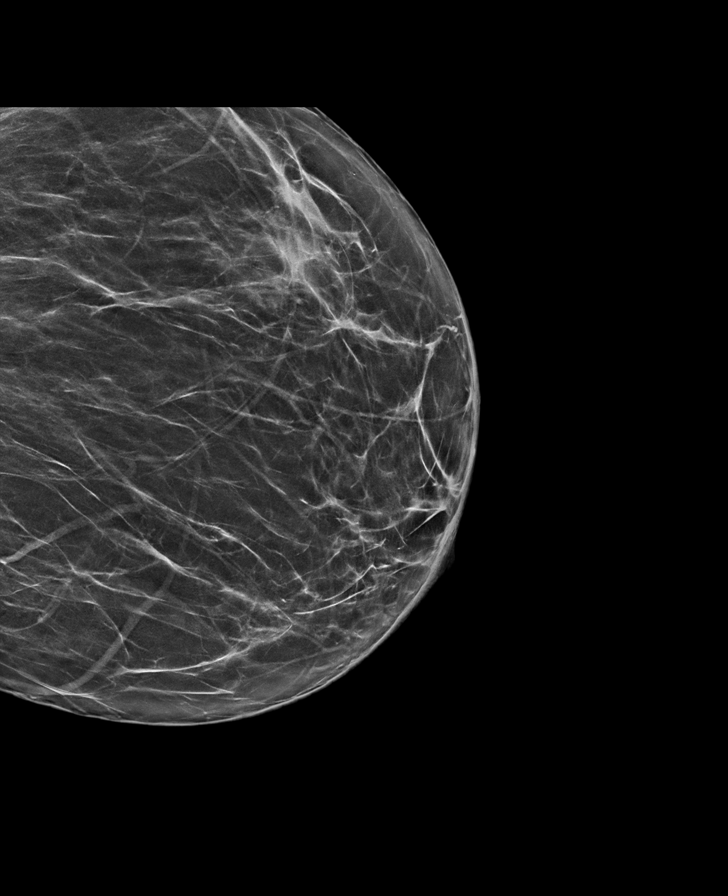

[L CC synth-2D (1 of 2)]
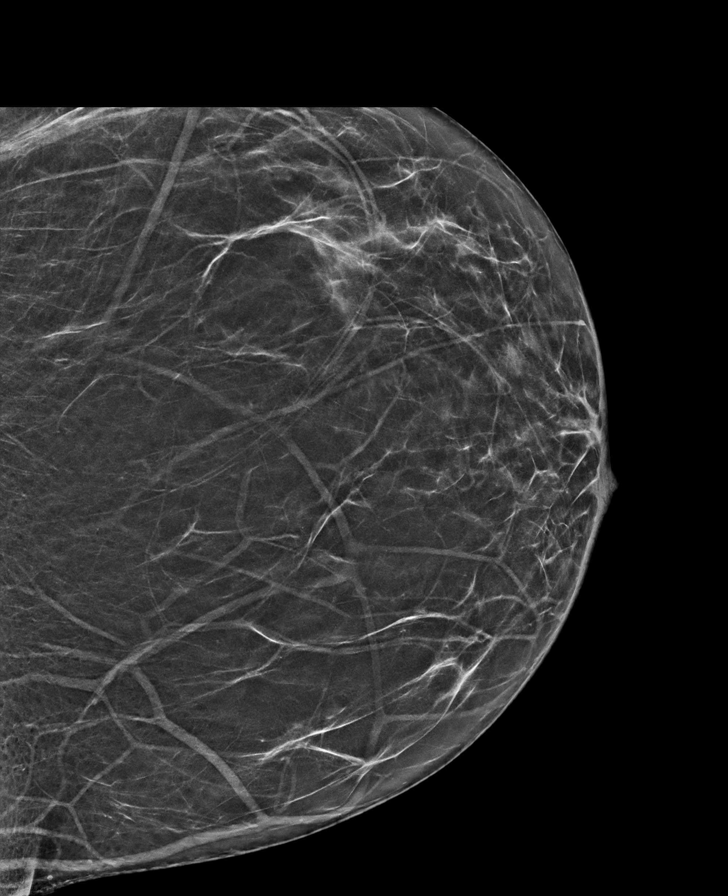

[R CC synth-2D]
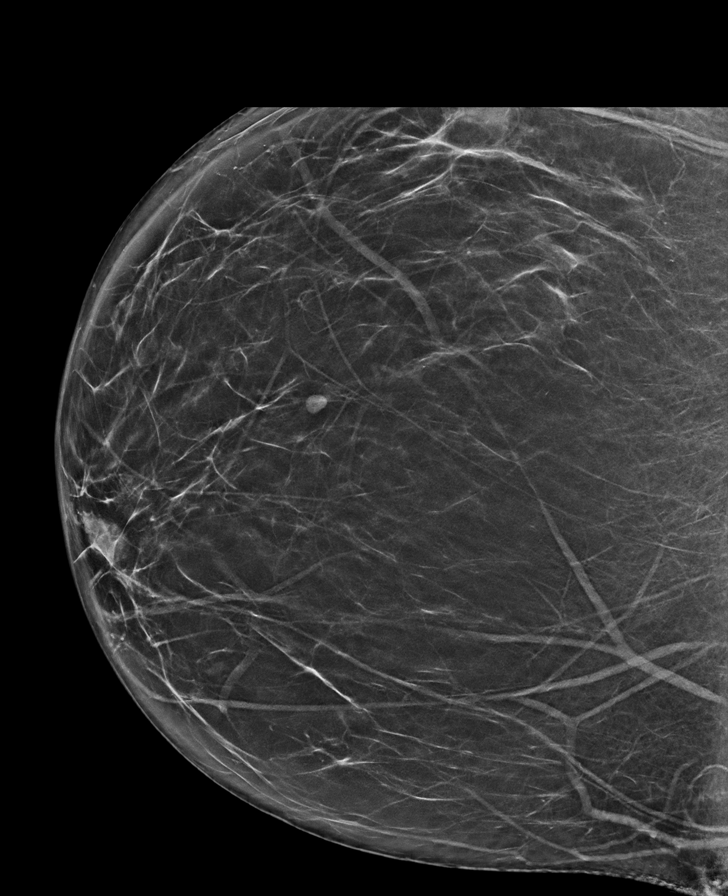

[L MLO synth-2D (2 of 2)]
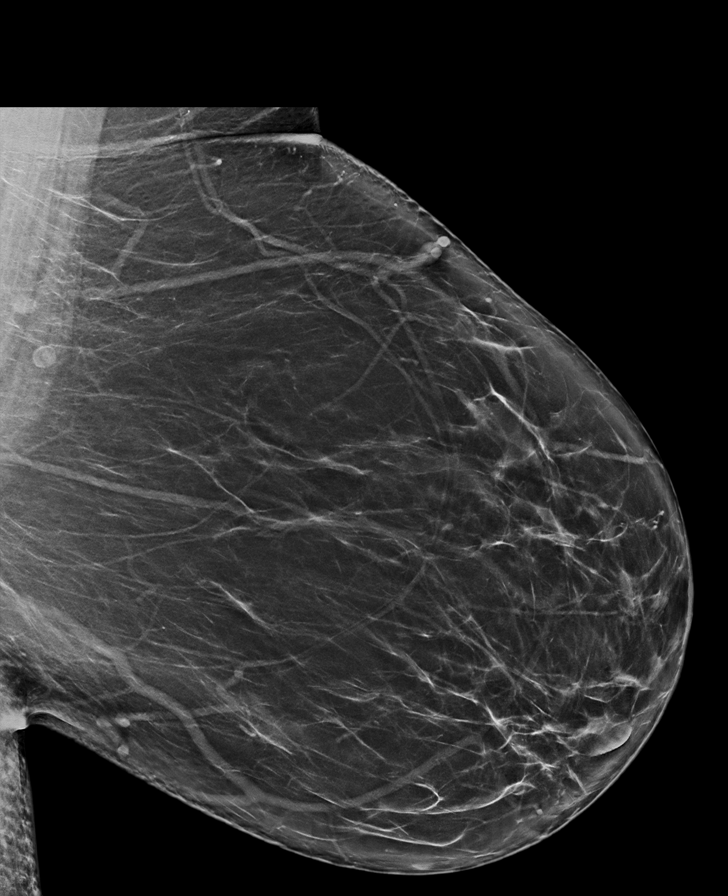

[R CV synth-2D]
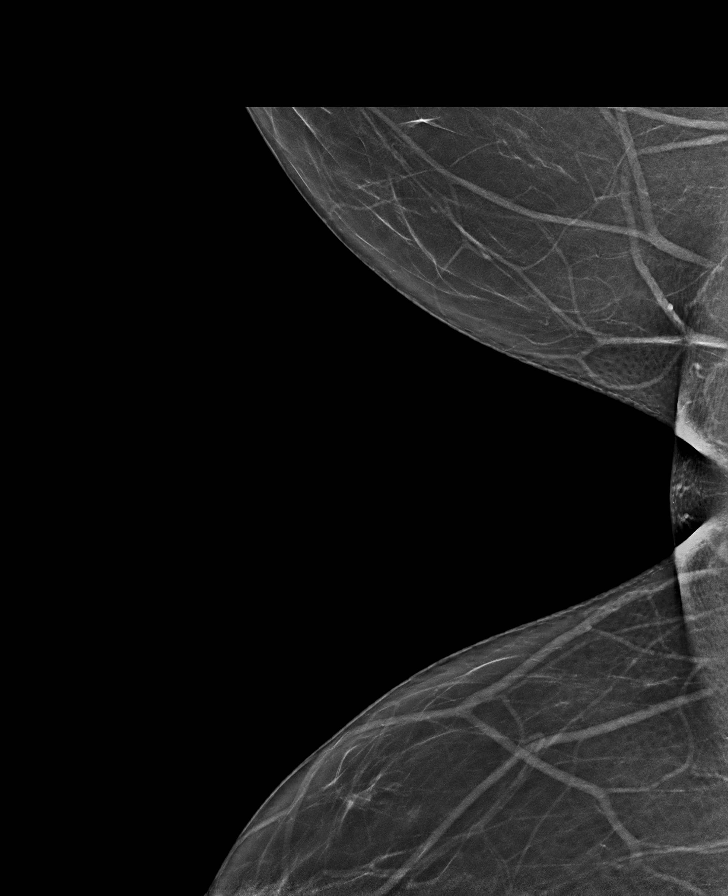

[L CC synth-2D (2 of 2)]
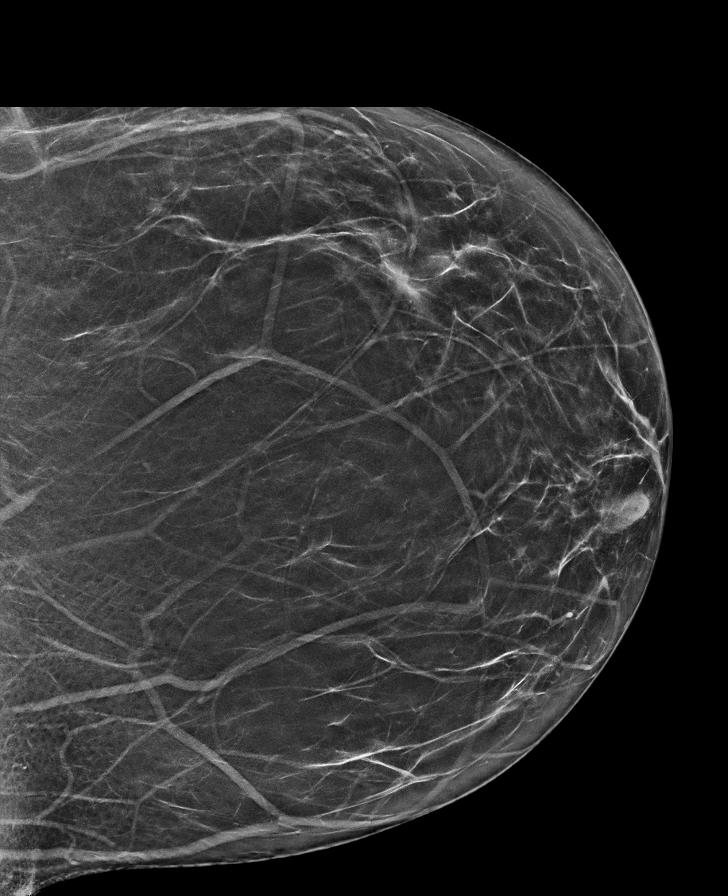

[R MLO synth-2D]
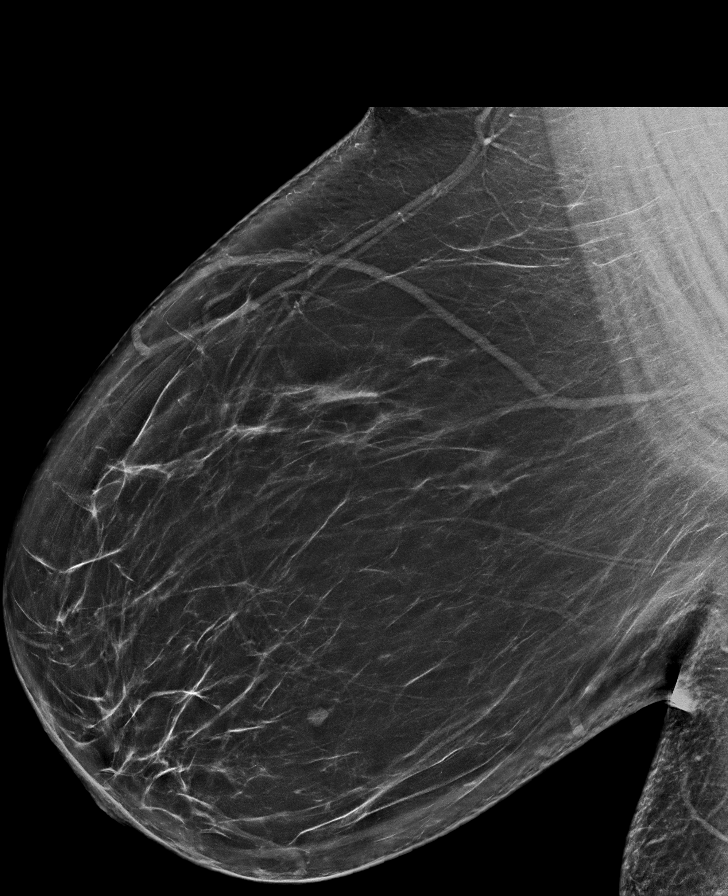

[L MLO tomo · tomo slice 45/89.0]
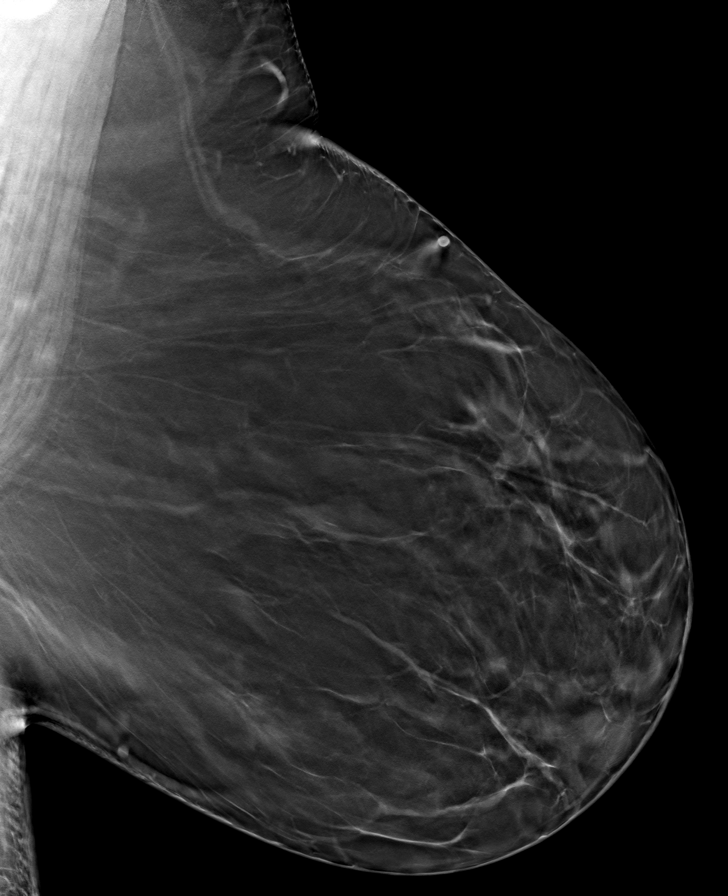

[8 of 40 positions shown; findings below may reference images not displayed]

ACR Breast Density Category b: There are scattered areas of
fibroglandular density.
FINDINGS: There are no findings suspicious for malignancy.
IMPRESSION: No mammographic evidence of malignancy. A result letter of this
screening mammogram will be mailed directly to the patient.

RECOMMENDATION:
Screening mammogram at age 39. (Code:[id number])

BI-RADS CATEGORY  1: Negative.

## 2023-03-14 ENCOUNTER — Ambulatory Visit
Admission: RE | Admit: 2023-03-14 | Discharge: 2023-03-14 | Disposition: A | Discharge status: Home / Self Care | Payer: No Typology Code available for payment source | Source: Ambulatory Visit

## 2023-03-14 VITALS — BP 107/75 | HR 96 | Temp 98.3°F | Resp 18

## 2023-03-14 DIAGNOSIS — J0100 Acute maxillary sinusitis, unspecified: Secondary | ICD-10-CM

## 2023-03-14 MED ORDER — AMOXICILLIN-POT CLAVULANATE 875-125 MG PO TABS: 1.0000 | ORAL_TABLET | Freq: Two times a day (BID) | ORAL | 0 refills | Status: AC

## 2023-03-14 NOTE — Discharge Instructions (Signed)
Today you are being treated for a sinus infection  Begin use of Augmentin every morning and every evening for 10 days    You can take Tylenol and/or Ibuprofen as needed for fever reduction and pain relief.   For cough: honey 1/2 to 1 teaspoon (you can dilute the honey in water or another fluid).  You can also use guaifenesin and dextromethorphan for cough. You can use a humidifier for chest congestion and cough.  If you don't have a humidifier, you can sit in the bathroom with the hot shower running.      For sore throat: try warm salt water gargles, cepacol lozenges, throat spray, warm tea or water with lemon/honey, popsicles or ice, or OTC cold relief medicine for throat discomfort.   For congestion: take a daily anti-histamine like Zyrtec, Claritin, and a oral decongestant, such as pseudoephedrine.  You can also use Flonase 1-2 sprays in each nostril daily.   It is important to stay hydrated: drink plenty of fluids (water, gatorade/powerade/pedialyte, juices, or teas) to keep your throat moisturized and help further relieve irritation/discomfort.

## 2023-03-14 NOTE — ED Triage Notes (Signed)
Patient to Urgent Care with complaints of nasal congestion/ headaches/ sinus pain and pressure. Denies any known fevers.   Reports symptoms started approx 8 days ago.  Has been taking Mucinex/ saline nasal sprays/ tylenol sinus.

## 2023-03-14 NOTE — ED Provider Notes (Signed)
Renaldo Fiddler    CSN: [id number] Arrival date & time: 03/14/23  0806      History   Chief Complaint Chief Complaint  Patient presents with   Nasal Congestion    I have a headache face pain and pressure and congestion in my nose that's been going on for a week now - Entered by patient    HPI Stefanie Kelley is a 37 y.o. female.   Patient presents for evaluation of nasal congestion, rhinorrhea, nonproductive cough, intermittent generalized headaches, mild bilateral ear fullness, sinus pressure around the cheeks present for 8 days.  No known sick contacts prior.  Tolerating food and liquids.  Has attempted use of Mucinex, Tylenol Sinus and nasal saline spray.  Denies shortness of breath and wheezing.  Past Medical History:  Diagnosis Date   Abdominal pain    Abnormal Pap smear    Depression    on meds, doing ok   Gallstones    GERD (gastroesophageal reflux disease)    Kidney stones    Obesity    Pregnancy induced hypertension    Preterm labor    due to [facility]    Patient Active Problem List   Diagnosis Date Noted   Pregnancy 01/31/2013   AGE (acute gastroenteritis) 01/31/2013   Gallstones 09/25/2012   Calculus of gallbladder with other cholecystitis, without mention of obstruction 09/25/2012   Abdominal pain    Cholelithiasis 09/18/2012   Abdominal pain 09/17/2012   History of gallstones 09/17/2012   Viral gastroenteritis 04/17/2012   Screening for STD (sexually transmitted disease) 03/16/2012   HEMATOCHEZIA 05/24/2010   ABNORMAL PAP SMEAR, LGSIL 05/14/2010   Migraine headache 08/07/2009   Asthma 08/07/2009   Calculus of gallbladder 08/07/2009   PRE-ECLAMPSIA 08/07/2009   Personal history of urinary disorder 08/07/2009   Personal history presenting hazards to health 08/07/2009    Past Surgical History:  Procedure Laterality Date   CHOLECYSTECTOMY  2014   FOOT SURGERY     TONSILLECTOMY      OB History     Gravida  2   Para  1   Term       Preterm      AB      Living  1      SAB      IAB      Ectopic      Multiple      Live Births           Obstetric Comments  1st Menstrual Cycle: 10 1st Pregnancy:  16          Home Medications    Prior to Admission medications   Medication Sig Start Date End Date Taking? Authorizing Provider  amoxicillin-clavulanate (AUGMENTIN) 875-125 MG tablet Take 1 tablet by mouth every 12 (twelve) hours for 10 days. 03/14/23 03/24/23 Yes Charleigh Correnti, Elita Boone, NP  metFORMIN (GLUCOPHAGE) 500 MG tablet Take by mouth. 08/02/21  Yes [provider]  Aspirin-Acetaminophen-Caffeine (GOODY HEADACHE PO) Take 1 packet by mouth every 6 (six) hours as needed (headache/pain).    [provider]  ibuprofen (ADVIL) 800 MG tablet Take 1 tablet (800 mg total) by mouth 3 (three) times daily. 07/03/20   Placido Sou, PA-C  ondansetron (ZOFRAN ODT) 4 MG disintegrating tablet Take 1 tablet (4 mg total) by mouth every 8 (eight) hours as needed for nausea or vomiting. 07/03/20   Placido Sou, PA-C  sertraline (ZOLOFT) 100 MG tablet Take 100 mg by mouth 2 (two) times daily. Patient  not taking: Reported on 03/14/2023    [provider]  tamsulosin (FLOMAX) 0.4 MG CAPS capsule Take 1 capsule (0.4 mg total) by mouth daily after breakfast. Patient not taking: Reported on 03/14/2023 07/03/20   Placido Sou, PA-C    Family History Family History  Problem Relation Age of Onset   Diabetes Brother    Diabetes Maternal Grandmother    Heart disease Maternal Grandfather    Hypertension Maternal Grandfather    Thyroid disease Mother    Hashimoto's thyroiditis Mother     Social History Social History   Tobacco Use   Smoking status: Former   Smokeless tobacco: Never   Tobacco comments:    Quit 11/2011  Vaping Use   Vaping status: Some Days   Substances: Nicotine, Flavoring  Substance Use Topics   Alcohol use: Yes    Comment: occ, none with preg   Drug use: No      Allergies   Latex and Nitrofurantoin   Review of Systems Review of Systems   Physical Exam Triage Vital Signs ED Triage Vitals  Encounter Vitals Group     BP 03/14/23 0812 107/75     Systolic BP Percentile --      Diastolic BP Percentile --      Pulse Rate 03/14/23 0812 96     Resp 03/14/23 0812 18     Temp 03/14/23 0812 98.3 F (36.8 C)     Temp src --      SpO2 03/14/23 0812 98 %     Weight --      Height --      Head Circumference --      Peak Flow --      Pain Score 03/14/23 0808 3     Pain Loc --      Pain Education --      Exclude from Growth Chart --    No data found.  Updated Vital Signs BP 107/75   Pulse 96   Temp 98.3 F (36.8 C)   Resp 18   LMP 03/07/2023   SpO2 98%   Visual Acuity Right Eye Distance:   Left Eye Distance:   Bilateral Distance:    Right Eye Near:   Left Eye Near:    Bilateral Near:     Physical Exam Constitutional:      Appearance: Normal appearance.  HENT:     Head: Normocephalic.     Right Ear: Tympanic membrane, ear canal and external ear normal.     Left Ear: Tympanic membrane, ear canal and external ear normal.     Nose: Congestion present. No rhinorrhea.     Right Sinus: Maxillary sinus tenderness present.     Left Sinus: Maxillary sinus tenderness present.     Mouth/Throat:     Pharynx: No oropharyngeal exudate or posterior oropharyngeal erythema.  Eyes:     Extraocular Movements: Extraocular movements intact.  Cardiovascular:     Rate and Rhythm: Normal rate and regular rhythm.     Pulses: Normal pulses.     Heart sounds: Normal heart sounds.  Pulmonary:     Effort: Pulmonary effort is normal.     Breath sounds: Normal breath sounds.  Musculoskeletal:     Cervical back: Normal range of motion.  Lymphadenopathy:     Cervical: Cervical adenopathy present.  Neurological:     Mental Status: She is alert and oriented to person, place, and time. Mental status is at baseline.      UC  Treatments /  Results  Labs (all labs ordered are listed, but only abnormal results are displayed) Labs Reviewed - No data to display  EKG   Radiology No results found.  Procedures Procedures (including critical care time)  Medications Ordered in UC Medications - No data to display  Initial Impression / Assessment and Plan / UC Course  I have reviewed the triage vital signs and the nursing notes.  Pertinent labs & imaging results that were available during my care of the patient were reviewed by me and considered in my medical decision making (see chart for details).  Acute Nonrecurrent maxillary sinusitis  Patient is in no signs of distress nor toxic appearing.  Vital signs are stable.  Low suspicion for pneumonia, pneumothorax or bronchitis and therefore will defer imaging.  Symptoms present for 8 days without signs of resolution, progressively worsening therefore providing bacterial coverage, prescribed Augmentin.May use additional over-the-counter medications as needed for supportive care.  May follow-up with urgent care as needed if symptoms persist or worsen.  Final Clinical Impressions(s) / UC Diagnoses   Final diagnoses:  Acute non-recurrent maxillary sinusitis     Discharge Instructions      Today you are being treated for a sinus infection  Begin use of Augmentin every morning and every evening for 10 days    You can take Tylenol and/or Ibuprofen as needed for fever reduction and pain relief.   For cough: honey 1/2 to 1 teaspoon (you can dilute the honey in water or another fluid).  You can also use guaifenesin and dextromethorphan for cough. You can use a humidifier for chest congestion and cough.  If you don't have a humidifier, you can sit in the bathroom with the hot shower running.      For sore throat: try warm salt water gargles, cepacol lozenges, throat spray, warm tea or water with lemon/honey, popsicles or ice, or OTC cold relief medicine for throat discomfort.    For congestion: take a daily anti-histamine like Zyrtec, Claritin, and a oral decongestant, such as pseudoephedrine.  You can also use Flonase 1-2 sprays in each nostril daily.   It is important to stay hydrated: drink plenty of fluids (water, gatorade/powerade/pedialyte, juices, or teas) to keep your throat moisturized and help further relieve irritation/discomfort.    ED Prescriptions     Medication Sig Dispense Auth. Provider   amoxicillin-clavulanate (AUGMENTIN) 875-125 MG tablet Take 1 tablet by mouth every 12 (twelve) hours for 10 days. 20 tablet Valinda Hoar, NP      PDMP not reviewed this encounter.   Valinda Hoar, NP 03/14/23 0900

## 2023-04-28 ENCOUNTER — Ambulatory Visit
Admission: RE | Admit: 2023-04-28 | Discharge: 2023-04-28 | Disposition: A | Discharge status: Home / Self Care | Payer: No Typology Code available for payment source | Source: Ambulatory Visit | Attending: Emergency Medicine | Admitting: Emergency Medicine

## 2023-04-28 VITALS — BP 122/83 | HR 95 | Temp 98.9°F | Resp 18

## 2023-04-28 DIAGNOSIS — A084 Viral intestinal infection, unspecified: Secondary | ICD-10-CM | POA: Diagnosis not present

## 2023-04-28 MED ORDER — AZITHROMYCIN 250 MG PO TABS: 250.0000 mg | ORAL_TABLET | Freq: Every day | ORAL | 0 refills | Status: DC

## 2023-04-28 MED ORDER — ONDANSETRON 4 MG PO TBDP: 4.0000 mg | ORAL_TABLET | Freq: Three times a day (TID) | ORAL | 0 refills | Status: DC | PRN

## 2023-04-28 MED ORDER — LOPERAMIDE HCL 2 MG PO CAPS: 2.0000 mg | ORAL_CAPSULE | Freq: Four times a day (QID) | ORAL | 0 refills | Status: DC | PRN

## 2023-04-28 NOTE — ED Triage Notes (Signed)
Patient presents to UC for HA, nausea, weakness, diarrhea, and decreased appetite x 3 days. No OTC meds for symptom relief.

## 2023-04-28 NOTE — ED Provider Notes (Signed)
Renaldo Fiddler    CSN: [id number] Arrival date & time: 04/28/23  0841      History   Chief Complaint Chief Complaint  Patient presents with   Abdominal Pain    Headache, stomach pains, diarrhea, chills - Entered by patient    HPI Stefanie Kelley is a 38 y.o. female.   Patient presents for evaluation of possible fever of 99.4, centralized abdominal pain exacerbated by bowel movements, watery diarrhea, nausea without vomiting and intermittent headaches present for 3 days.  Last occurrence of diarrhea this morning.  Able to tolerate some fluids and blander foods but decreased intake.  Denies URI symptoms, dietary changes or recent travel.  Has attempted use of Tylenol and ibuprofen.   Past Medical History:  Diagnosis Date   Abdominal pain    Abnormal Pap smear    Depression    on meds, doing ok   Gallstones    GERD (gastroesophageal reflux disease)    Kidney stones    Obesity    Pregnancy induced hypertension    Preterm labor    due to [facility]    Patient Active Problem List   Diagnosis Date Noted   Pregnancy 01/31/2013   AGE (acute gastroenteritis) 01/31/2013   Gallstones 09/25/2012   Calculus of gallbladder with other cholecystitis, without mention of obstruction 09/25/2012   Abdominal pain    Cholelithiasis 09/18/2012   Abdominal pain 09/17/2012   History of gallstones 09/17/2012   Viral gastroenteritis 04/17/2012   Screening for STD (sexually transmitted disease) 03/16/2012   HEMATOCHEZIA 05/24/2010   ABNORMAL PAP SMEAR, LGSIL 05/14/2010   Migraine headache 08/07/2009   Asthma 08/07/2009   Calculus of gallbladder 08/07/2009   PRE-ECLAMPSIA 08/07/2009   Personal history of urinary disorder 08/07/2009   Personal history presenting hazards to health 08/07/2009    Past Surgical History:  Procedure Laterality Date   CHOLECYSTECTOMY  2014   FOOT SURGERY     TONSILLECTOMY      OB History     Gravida  2   Para  1   Term      Preterm      AB       Living  1      SAB      IAB      Ectopic      Multiple      Live Births           Obstetric Comments  1st Menstrual Cycle: 10 1st Pregnancy:  16          Home Medications    Prior to Admission medications   Medication Sig Start Date End Date Taking? Authorizing Provider  azithromycin (ZITHROMAX) 250 MG tablet Take 1 tablet (250 mg total) by mouth daily. Take first 2 tablets together, then 1 every day until finished. 05/01/23  Yes Adaliz Dobis, Elita Boone, NP  loperamide (IMODIUM) 2 MG capsule Take 1 capsule (2 mg total) by mouth 4 (four) times daily as needed for diarrhea or loose stools. 04/28/23  Yes Conleigh Heinlein R, NP  ondansetron (ZOFRAN-ODT) 4 MG disintegrating tablet Take 1 tablet (4 mg total) by mouth every 8 (eight) hours as needed for nausea or vomiting. 04/28/23  Yes Lucill Mauck R, NP  Aspirin-Acetaminophen-Caffeine (GOODY HEADACHE PO) Take 1 packet by mouth every 6 (six) hours as needed (headache/pain).    [provider]  ibuprofen (ADVIL) 800 MG tablet Take 1 tablet (800 mg total) by mouth 3 (three) times daily. 07/03/20   Mariah Milling,  [place], PA-C  metFORMIN (GLUCOPHAGE) 500 MG tablet Take by mouth. 08/02/21   [provider]  sertraline (ZOLOFT) 100 MG tablet Take 100 mg by mouth 2 (two) times daily. Patient not taking: Reported on 03/14/2023    [provider]  tamsulosin (FLOMAX) 0.4 MG CAPS capsule Take 1 capsule (0.4 mg total) by mouth daily after breakfast. Patient not taking: Reported on 03/14/2023 07/03/20   Placido Sou, PA-C    Family History Family History  Problem Relation Age of Onset   Diabetes Brother    Diabetes Maternal Grandmother    Heart disease Maternal Grandfather    Hypertension Maternal Grandfather    Thyroid disease Mother    Hashimoto's thyroiditis Mother     Social History Social History   Tobacco Use   Smoking status: Former   Smokeless tobacco: Never   Tobacco comments:    Quit 11/2011  Vaping  Use   Vaping status: Some Days   Substances: Nicotine, Flavoring  Substance Use Topics   Alcohol use: Yes    Comment: occ, none with preg   Drug use: No     Allergies   Latex and Nitrofurantoin   Review of Systems Review of Systems   Physical Exam Triage Vital Signs ED Triage Vitals  Encounter Vitals Group     BP 04/28/23 0917 122/83     Systolic BP Percentile --      Diastolic BP Percentile --      Pulse Rate 04/28/23 0917 95     Resp 04/28/23 0917 18     Temp 04/28/23 0917 98.9 F (37.2 C)     Temp Source 04/28/23 0917 Oral     SpO2 04/28/23 0917 97 %     Weight --      Height --      Head Circumference --      Peak Flow --      Pain Score 04/28/23 0915 0     Pain Loc --      Pain Education --      Exclude from Growth Chart --    No data found.  Updated Vital Signs BP 122/83 (BP Location: Left Arm)   Pulse 95   Temp 98.9 F (37.2 C) (Oral)   Resp 18   LMP 04/24/2023 (Exact Date)   SpO2 97%   Visual Acuity Right Eye Distance:   Left Eye Distance:   Bilateral Distance:    Right Eye Near:   Left Eye Near:    Bilateral Near:     Physical Exam Constitutional:      Appearance: Normal appearance.  HENT:     Head: Normocephalic.  Eyes:     Extraocular Movements: Extraocular movements intact.  Pulmonary:     Effort: Pulmonary effort is normal.  Abdominal:     General: Abdomen is flat. Bowel sounds are normal.     Palpations: Abdomen is soft.     Tenderness: There is abdominal tenderness in the epigastric area.  Neurological:     Mental Status: She is alert and oriented to person, place, and time. Mental status is at baseline.      UC Treatments / Results  Labs (all labs ordered are listed, but only abnormal results are displayed) Labs Reviewed - No data to display  EKG   Radiology No results found.  Procedures Procedures (including critical care time)  Medications Ordered in UC Medications - No data to display  Initial  Impression / Assessment and Plan / UC  Course  I have reviewed the triage vital signs and the nursing notes.  Pertinent labs & imaging results that were available during my care of the patient were reviewed by me and considered in my medical decision making (see chart for details).  Viral gastroenteritis  Vitals are stable, patient in no signs of distress nontoxic-appearing, presentation consistent with viral gastroenteritis, stomach bug prevalent in the area, discussed this with patient,, stable for outpatient management, prescribe Zofran and Imodium and watch wait antibiotic placed at pharmacy if no improvement seen, discussed additional supportive care with follow-up as needed Final Clinical Impressions(s) / UC Diagnoses   Final diagnoses:  Viral gastroenteritis     Discharge Instructions      Your symptoms are most likely caused by a virus, it will work its way out your system over the next few days no improvement seen by Monday you may pick up azithromycin from pharmacy and begin use  You can use zofran every 8 hours as needed for nausea, be mindful this medication may make you drowsy, take the first dose at home to see how it affects your body  You can use Imodium to help with diarrhea, and be mindful over use of this medication may cause opposite effect constipation  You can use over-the-counter ibuprofen or Tylenol, which ever you have at home, to help manage fevers  Continue to promote hydration throughout the day by using electrolyte replacement solution such as Gatorade, body armor, Pedialyte, which ever you have at home  Try eating bland foods such as bread, rice, toast, fruit which are easier on the stomach to digest, avoid foods that are overly spicy, overly seasoned or greasy    ED Prescriptions     Medication Sig Dispense Auth. Provider   loperamide (IMODIUM) 2 MG capsule Take 1 capsule (2 mg total) by mouth 4 (four) times daily as needed for diarrhea or loose stools.  12 capsule Layza Summa R, NP   ondansetron (ZOFRAN-ODT) 4 MG disintegrating tablet Take 1 tablet (4 mg total) by mouth every 8 (eight) hours as needed for nausea or vomiting. 20 tablet Mackayla Mullins R, NP   azithromycin (ZITHROMAX) 250 MG tablet Take 1 tablet (250 mg total) by mouth daily. Take first 2 tablets together, then 1 every day until finished. 6 tablet Valinda Hoar, NP      PDMP not reviewed this encounter.   Valinda Hoar, [place] 04/28/23 [phone]

## 2023-04-28 NOTE — Discharge Instructions (Addendum)
Your symptoms are most likely caused by a virus, it will work its way out your system over the next few days no improvement seen by Monday you may pick up azithromycin from pharmacy and begin use  You can use zofran every 8 hours as needed for nausea, be mindful this medication may make you drowsy, take the first dose at home to see how it affects your body  You can use Imodium to help with diarrhea, and be mindful over use of this medication may cause opposite effect constipation  You can use over-the-counter ibuprofen or Tylenol, which ever you have at home, to help manage fevers  Continue to promote hydration throughout the day by using electrolyte replacement solution such as Gatorade, body armor, Pedialyte, which ever you have at home  Try eating bland foods such as bread, rice, toast, fruit which are easier on the stomach to digest, avoid foods that are overly spicy, overly seasoned or greasy

## 2023-05-02 ENCOUNTER — Telehealth: Payer: No Typology Code available for payment source | Admitting: Physician Assistant

## 2023-05-02 DIAGNOSIS — A084 Viral intestinal infection, unspecified: Secondary | ICD-10-CM

## 2023-05-02 NOTE — Progress Notes (Signed)
   Thank you for the details you included in the comment boxes. Those details are very helpful in determining the best course of treatment for you and help [place] to provide the best care.Because you have been seen in person on 04/28/23 and have had medications previously prescribed, we recommend that you schedule a Virtual Urgent Care video visit in order for the provider to better assess what is going on.  The provider will be able to give you a more accurate diagnosis and treatment plan if we can more freely discuss your symptoms and with the addition of a virtual examination.   If you change your visit to a video visit, we will bill your insurance (similar to an office visit) and you will not be charged for this e-Visit. You will be able to stay at home and speak with the first available [facility] advanced practice provider. The link to do a video visit is in the drop down Menu tab of your Welcome screen in MyChart.      I have spent 5 minutes in review of e-visit questionnaire, review and updating patient chart, medical decision making and response to patient.   Margaretann Loveless, PA-C

## 2023-05-27 ENCOUNTER — Encounter: Payer: Self-pay | Admitting: Emergency Medicine

## 2023-05-27 ENCOUNTER — Ambulatory Visit
Admission: EM | Admit: 2023-05-27 | Discharge: 2023-05-27 | Disposition: A | Discharge status: Home / Self Care | Payer: No Typology Code available for payment source | Attending: Internal Medicine | Admitting: Internal Medicine

## 2023-05-27 ENCOUNTER — Other Ambulatory Visit: Payer: Self-pay

## 2023-05-27 DIAGNOSIS — R6889 Other general symptoms and signs: Secondary | ICD-10-CM

## 2023-05-27 HISTORY — DX: Type 2 diabetes mellitus without complications: E11.9

## 2023-05-27 MED ORDER — BENZONATATE 200 MG PO CAPS: 200.0000 mg | ORAL_CAPSULE | Freq: Three times a day (TID) | ORAL | 0 refills | Status: DC | PRN

## 2023-05-27 MED ORDER — PSEUDOEPHEDRINE HCL ER 120 MG PO TB12: 120.0000 mg | ORAL_TABLET | Freq: Two times a day (BID) | ORAL | 0 refills | Status: DC

## 2023-05-27 MED ORDER — KETOROLAC TROMETHAMINE 10 MG PO TABS: 10.0000 mg | ORAL_TABLET | Freq: Four times a day (QID) | ORAL | 0 refills | Status: DC | PRN

## 2023-05-27 NOTE — ED Triage Notes (Signed)
 Patient presents to [facility] for evaluation of headache x 1 week. In the last few days she has began to have body aches, chills, cough, runny nose and intermittent diarrhea.  Denies n/v.

## 2023-05-27 NOTE — Discharge Instructions (Signed)
 I suspect you have had the flu and is going to take a few more days to fight it on your own.

## 2023-05-27 NOTE — ED Provider Notes (Signed)
 [facility]    CSN: [id number] Arrival date & time: 05/27/23  1148      History   Chief Complaint Chief Complaint  Patient presents with   URI    HPI Stefanie Kelley is a 38 y.o. female who presens with history of HA x 1 week, then the following day develops nose congestion, and as the day has progressed the HA continued and developed mild aches mid week and the aches felt worse yesterday. Her daughter has been at her boyfriend's home whom have had flu. Has mild diarrhea for a couple of days at the beginning of her illness but has resolved.     Past Medical History:  Diagnosis Date   Abdominal pain    Abnormal Pap smear    Depression    on meds, doing ok   Diabetes mellitus without complication (HCC)    Gallstones    GERD (gastroesophageal reflux disease)    Kidney stones    Obesity    Pregnancy induced hypertension    Preterm labor    due to [facility]    Patient Active Problem List   Diagnosis Date Noted   Pregnancy 01/31/2013   AGE (acute gastroenteritis) 01/31/2013   Gallstones 09/25/2012   Calculus of gallbladder with other cholecystitis, without mention of obstruction 09/25/2012   Abdominal pain    Cholelithiasis 09/18/2012   Abdominal pain 09/17/2012   History of gallstones 09/17/2012   Viral gastroenteritis 04/17/2012   Screening for STD (sexually transmitted disease) 03/16/2012   HEMATOCHEZIA 05/24/2010   ABNORMAL PAP SMEAR, LGSIL 05/14/2010   Migraine headache 08/07/2009   Asthma 08/07/2009   Calculus of gallbladder 08/07/2009   PRE-ECLAMPSIA 08/07/2009   Personal history of urinary disorder 08/07/2009   Personal history presenting hazards to health 08/07/2009    Past Surgical History:  Procedure Laterality Date   CHOLECYSTECTOMY  2014   FOOT SURGERY     TONSILLECTOMY      OB History     Gravida  2   Para  1   Term      Preterm      AB      Living  1      SAB      IAB      Ectopic      Multiple      Live Births            Obstetric Comments  1st Menstrual Cycle: 10 1st Pregnancy:  16          Home Medications    Prior to Admission medications   Medication Sig Start Date End Date Taking? Authorizing Provider  benzonatate (TESSALON) 200 MG capsule Take 1 capsule (200 mg total) by mouth 3 (three) times daily as needed for cough. 05/27/23  Yes Rodriguez-Southworth, Nettie Elm, PA-C  ketorolac (TORADOL) 10 MG tablet Take 1 tablet (10 mg total) by mouth every 6 (six) hours as needed. 05/27/23  Yes Rodriguez-Southworth, Nettie Elm, PA-C  pseudoephedrine (SUDAFED) 120 MG 12 hr tablet Take 1 tablet (120 mg total) by mouth 2 (two) times daily. 05/27/23  Yes Rodriguez-Southworth, Nettie Elm, PA-C  ibuprofen (ADVIL) 800 MG tablet Take 1 tablet (800 mg total) by mouth 3 (three) times daily. 07/03/20   Placido Sou, PA-C  loperamide (IMODIUM) 2 MG capsule Take 1 capsule (2 mg total) by mouth 4 (four) times daily as needed for diarrhea or loose stools. 04/28/23   Valinda Hoar, NP  metFORMIN (GLUCOPHAGE) 500 MG tablet Take by mouth.  08/02/21   [provider]  ondansetron (ZOFRAN-ODT) 4 MG disintegrating tablet Take 1 tablet (4 mg total) by mouth every 8 (eight) hours as needed for nausea or vomiting. 04/28/23   Valinda Hoar, NP    Family History Family History  Problem Relation Age of Onset   Diabetes Brother    Diabetes Maternal Grandmother    Heart disease Maternal Grandfather    Hypertension Maternal Grandfather    Thyroid disease Mother    Hashimoto's thyroiditis Mother     Social History Social History   Tobacco Use   Smoking status: Former   Smokeless tobacco: Never   Tobacco comments:    Quit 11/2011  Vaping Use   Vaping status: Some Days   Substances: Nicotine, Flavoring  Substance Use Topics   Alcohol use: Yes    Comment: occ, none with preg   Drug use: No     Allergies   Latex and Nitrofurantoin   Review of Systems Review of Systems As noted in HPI Physical  Exam Triage Vital Signs ED Triage Vitals [05/27/23 1208]  Encounter Vitals Group     BP 126/82     Systolic BP Percentile      Diastolic BP Percentile      Pulse Rate 85     Resp 18     Temp 99.1 F (37.3 C)     Temp Source Oral     SpO2 96 %     Weight      Height      Head Circumference      Peak Flow      Pain Score 2     Pain Loc      Pain Education      Exclude from Growth Chart    No data found.  Updated Vital Signs BP 126/82 (BP Location: Left Arm)   Pulse 85   Temp 99.1 F (37.3 C) (Oral)   Resp 18   LMP 05/16/2023 (Exact Date)   SpO2 96%   Visual Acuity Right Eye Distance:   Left Eye Distance:   Bilateral Distance:    Right Eye Near:   Left Eye Near:    Bilateral Near:     Physical Exam  Physical Exam Vitals signs and nursing note reviewed.  Constitutional:      General: She is not in acute distress.    Appearance: Normal appearance. She is not ill-appearing, toxic-appearing or diaphoretic.  HENT:     Head: Normocephalic.     Right Ear: Tympanic membrane, ear canal and external ear normal.     Left Ear: Tympanic membrane, ear canal and external ear normal.     Nose: Nose normal.     Mouth/Throat: clear    Mouth: Mucous membranes are moist.  Eyes:     General: No scleral icterus.       Right eye: No discharge.        Left eye: No discharge.     Conjunctiva/sclera: Conjunctivae normal.  Neck:     Musculoskeletal: Neck supple. No neck rigidity.  Cardiovascular:     Rate and Rhythm: Normal rate and regular rhythm.     Heart sounds: No murmur.  Pulmonary:     Effort: Pulmonary effort is normal.     Breath sounds: Normal breath sounds.  otion.  Lymphadenopathy:     Cervical: No cervical adenopathy.  Skin:    General: Skin is warm and dry.     Coloration: Skin is not jaundiced.  Findings: No rash.  Neurological:     Mental Status: She is alert and oriented to person, place, and time.     Gait: Gait normal.  Psychiatric:        Mood  and Affect: Mood normal.        Behavior: Behavior normal.        Thought Content: Thought content normal.        Judgment: Judgment normal.   UC Treatments / Results  Labs (all labs ordered are listed, but only abnormal results are displayed) Labs Reviewed - No data to display  EKG   Radiology No results found.  Procedures Procedures (including critical care time)  Medications Ordered in UC Medications - No data to display  Initial Impression / Assessment and Plan / UC Course  I have reviewed the triage vital signs and the nursing notes.  Flu like symptoms- I suspect she has had this and is recovering  I placed her on Tessalon. Sudafed and Toradol as noted. I reviewed secondary infections signs she needs to look for.  Final Clinical Impressions(s) / UC Diagnoses   Final diagnoses:  Flu-like symptoms     Discharge Instructions      I suspect you have had the flu and is going to take a few more days to fight it on your own.       ED Prescriptions     Medication Sig Dispense Auth. Provider   pseudoephedrine (SUDAFED) 120 MG 12 hr tablet Take 1 tablet (120 mg total) by mouth 2 (two) times daily. 14 tablet Rodriguez-Southworth, Ahlijah Raia, PA-C   ketorolac (TORADOL) 10 MG tablet Take 1 tablet (10 mg total) by mouth every 6 (six) hours as needed. 20 tablet Rodriguez-Southworth, Nettie Elm, PA-C   benzonatate (TESSALON) 200 MG capsule Take 1 capsule (200 mg total) by mouth 3 (three) times daily as needed for cough. 30 capsule Rodriguez-Southworth, Nettie Elm, PA-C      PDMP not reviewed this encounter.   Garey Ham, PA-C 05/27/23 1231

## 2023-05-28 ENCOUNTER — Ambulatory Visit: Payer: No Typology Code available for payment source

## 2023-06-14 ENCOUNTER — Other Ambulatory Visit: Payer: Self-pay

## 2023-06-14 DIAGNOSIS — Z87440 Personal history of urinary (tract) infections: Secondary | ICD-10-CM

## 2023-06-15 ENCOUNTER — Ambulatory Visit (INDEPENDENT_AMBULATORY_CARE_PROVIDER_SITE_OTHER): Admitting: Urology

## 2023-06-15 VITALS — BP 136/84 | HR 91 | Ht 66.0 in | Wt 298.0 lb

## 2023-06-15 DIAGNOSIS — R300 Dysuria: Secondary | ICD-10-CM | POA: Diagnosis not present

## 2023-06-15 DIAGNOSIS — N3010 Interstitial cystitis (chronic) without hematuria: Secondary | ICD-10-CM

## 2023-06-15 DIAGNOSIS — R102 Pelvic and perineal pain unspecified side: Secondary | ICD-10-CM

## 2023-06-15 DIAGNOSIS — N390 Urinary tract infection, site not specified: Secondary | ICD-10-CM

## 2023-06-15 DIAGNOSIS — Z8052 Family history of malignant neoplasm of bladder: Secondary | ICD-10-CM | POA: Diagnosis not present

## 2023-06-15 NOTE — Patient Instructions (Signed)
 Eating Plan for Interstitial Cystitis Interstitial cystitis (IC) is a long-term (chronic) condition that can cause pain and pressure near your bladder. It can also make you have to pee urgently and often. Symptoms may come and go. Certain foods may trigger your symptoms. Learning what foods bother you can help you come up with an eating plan to manage IC. What are tips for following this plan? You may want to work with an expert in healthy eating called a dietitian. They can help you make an eating plan by doing an elimination diet. This diet involves: Making a list of foods that you think trigger your symptoms. You may also want to include foods that often cause symptoms in other people with IC. It may take a few months to find out which foods bother you. Taking those foods out of your diet for about a month. After that month, you can try to have the foods again one at a time to see which ones cause symptoms. Reading food labels Once you know which foods trigger your symptoms, you can avoid them. But it's still a good idea to read food labels. Some foods that cause your symptoms may be ingredients in other foods. These foods may include: Soy. Worcestershire sauce. Vinegar. Alcohol. Artificial sweeteners. Monosodium glutamate. Other triggers may include: Chili peppers. Tomato products. Citrus fruits, flavors, or juices. Shopping Shopping can be hard if many foods trigger your IC. Bring a list of the foods you can't eat with you when you go to the store. You can get an app for your phone that lets you know which foods are safer and which ones you may want to avoid. You can find the app at the [employer] website: ic-network.com Meal planning Plan your meals based on the results of your elimination diet. If you haven't done the diet yet, plan meals based on IC food lists. These lists may be given to you by your health care provider or dietitian. The lists tell you which foods are least and most  likely to cause symptoms. Avoid certain types of food when you go out to eat. These may include: Pizza. [place] food. [place] food. [place] food. General information Do not eat large portions. Drink lots of fluids with your meals. Do not eat foods that are high in sugar, salt, or saturated fat. Choose whole fruits instead of juice. Eat a colorful variety of vegetables. Find ways to manage stress. Get enough exercise. What foods should I eat? For people with IC, the best diet is a balanced one. This means it includes things from all the food groups. Even if you have to avoid certain foods, there are still lots of healthy choices in each group. Below are some foods that may be safest for you to eat: Fruits Bananas. Blueberries and blueberry juice. Melons. Pears. Apples. Dates. Prunes. Raisins. Apricots. Vegetables Asparagus. Avocado. Celery. Beets. Bell peppers. Black olives. Broccoli. Brussels sprouts. Cabbage. Carrots. Cauliflower. Cucumber. Eggplant. Green beans. Potatoes. Radishes. Spinach. Squash. Turnips. Zucchini. Mushrooms. Peas. Grains Oats. Rice. Bran. Oatmeal. Whole wheat bread. Meats and other proteins Beef. Fish and other seafood. Eggs. Nuts. Peanut butter. Pork. Poultry. Lamb. Garbanzo beans. Pinto beans. Dairy Whole or low-fat milk. American, mozzarella, mild cheddar, feta, ricotta, and cream cheeses. The items listed above may not be all the foods and drinks you can have. Talk to a dietitian to learn more. What foods should I avoid? You should avoid any foods that cause symptoms. It's also a good idea to avoid  foods that often cause symptoms in many people with IC. These foods include: Fruits Citrus fruits, such as lemons, limes, oranges, and grapefruit. Cranberries. Strawberries. Pineapple. Kiwi. Vegetables Chili peppers. Onions. Sauerkraut. Tomato and tomato products. Rosita Fire. Grains You don't need to avoid any type of grain unless it causes symptoms. Meats and other  proteins Precooked or cured meats, such as sausages or meat loaves. Soy products. Dairy Chocolate ice cream. Processed cheese. Yogurt. Drinks Alcohol. Chocolate drinks. Coffee. Cranberry juice. Fizzy drinks. Black, green, or herbal tea. Tomato juice. Sports drinks. The items listed above may not be all the foods and drinks you should avoid. Talk to a dietitian to learn more. This information is not intended to replace advice given to you by your health care provider. Make sure you discuss any questions you have with your health care provider. Document Revised: 07/07/2022 Document Reviewed: 07/07/2022 Elsevier Patient Education  2024 Elsevier Inc.   Cystoscopy Cystoscopy is a procedure that is used to help diagnose and sometimes treat conditions that affect the lower urinary tract. The lower urinary tract includes the bladder and the urethra. The urethra is the tube that drains urine from the bladder. Cystoscopy is done using a thin, tube-shaped instrument with a light and camera at the end (cystoscope). The cystoscope may be hard or flexible, depending on the goal of the procedure. The cystoscope is inserted through the urethra, into the bladder. Cystoscopy may be recommended if you have: Urinary tract infections that keep coming back. Blood in the urine (hematuria). An inability to control when you urinate (urinary incontinence) or an overactive bladder. Unusual cells found in a urine sample. A blockage in the urethra, such as a urinary stone. Painful urination. An abnormality in the bladder found during an intravenous pyelogram (IVP) or CT scan. What are the risks? Generally, this is a safe procedure. However, problems may occur, including: Infection. Bleeding.  What happens during the procedure?  You will be given one or more of the following: A medicine to numb the area (local anesthetic). The area around the opening of your urethra will be cleaned. The cystoscope will be passed  through your urethra into your bladder. Germ-free (sterile) fluid will flow through the cystoscope to fill your bladder. The fluid will stretch your bladder so that your health care provider can clearly examine your bladder walls. Your doctor will look at the urethra and bladder. The cystoscope will be removed The procedure may vary among health care providers  What can I expect after the procedure? After the procedure, it is common to have: Some soreness or pain in your urethra. Urinary symptoms. These include: Mild pain or burning when you urinate. Pain should stop within a few minutes after you urinate. This may last for up to a few days after the procedure. A small amount of blood in your urine for several days. Feeling like you need to urinate but producing only a small amount of urine. Follow these instructions at home: General instructions Return to your normal activities as told by your health care provider.  Drink plenty of fluids after the procedure. Keep all follow-up visits as told by your health care provider. This is important. Contact a health care provider if you: Have pain that gets worse or does not get better with medicine, especially pain when you urinate lasting longer than 72 hours after the procedure. Have trouble urinating. Get help right away if you: Have blood clots in your urine. Have a fever or chills. Are unable  to urinate. Summary Cystoscopy is a procedure that is used to help diagnose and sometimes treat conditions that affect the lower urinary tract. Cystoscopy is done using a thin, tube-shaped instrument with a light and camera at the end. After the procedure, it is common to have some soreness or pain in your urethra. It is normal to have blood in your urine after the procedure.  If you were prescribed an antibiotic medicine, take it as told by your health care provider.  This information is not intended to replace advice given to you by your health  care provider. Make sure you discuss any questions you have with your health care provider. Document Revised: 03/20/2018 Document Reviewed: 03/20/2018 Elsevier Patient Education  2020 [employer].

## 2023-06-15 NOTE — Progress Notes (Signed)
 06/15/23 12:58 PM   Stefanie Kelley 23-Apr-1985 [id number]  CC: " Recurrent UTI", urinary symptoms, family history of bladder cancer  HPI: 37 year old female with medical history notable for morbid obesity and BMI of 48, fibromyalgia, IBS referred for " recurrent UTI" and urinary symptoms.  She has had numerous urine samples over the last year which have all been contaminated with squamous cells, and all urine cultures have been negative.  She has been treated with antibiotics multiple times with no improvement in her urinary symptoms.  She reports intermittent bladder pain, pressure, and dysuria.  She is unable to identify any inciting or alleviating events.  She does not have any problems with nocturia.  Has some urgency and frequency, reportedly previously trialed on an OAB medication with minimal improvement.  She drinks only water during the day.  She does report a family history of bladder cancer in her family.  She strongly desires cystoscopy to evaluate for bladder cancer.  She was unable to void for urinalysis today   PMH: Past Medical History:  Diagnosis Date   Abdominal pain    Abnormal Pap smear    Depression    on meds, doing ok   Diabetes mellitus without complication (HCC)    Gallstones    GERD (gastroesophageal reflux disease)    Kidney stones    Obesity    Pregnancy induced hypertension    Preterm labor    due to [facility]     Family History: Family History  Problem Relation Age of Onset   Diabetes Brother    Diabetes Maternal Grandmother    Heart disease Maternal Grandfather    Hypertension Maternal Grandfather    Thyroid disease Mother    Hashimoto's thyroiditis Mother     Social History:  reports that she has quit smoking. She has never used smokeless tobacco. She reports current alcohol use. She reports that she does not use drugs.  Physical Exam: LMP 05/16/2023 (Exact Date)    Constitutional:  Alert and oriented, No acute distress. Cardiovascular:  No clubbing, cyanosis, or edema. Respiratory: Normal respiratory effort, no increased work of breathing. GI: Abdomen is soft, nontender, nondistended, no abdominal masses   Laboratory Data: Reviewed extensively, see HPI  Pertinent Imaging: I have personally viewed and interpreted the CT abdomen and pelvis dated 05/13/2022 showing a punctate right upper pole stone but no hydronephrosis or other urologic abnormalities.  Assessment & Plan:   38 year old female with fibromyalgia and IBS, family history of bladder cancer who presents with intermittent urinary symptoms of pelvic pain/pressure, and dysuria.  Cultures have all been negative.  CT last year benign.  We discussed the complexities of pelvic pain and possible range of etiologies including pelvic floor dysfunction, chronic bladder pain syndrome, and interstitial cystitis.  We reviewed the AUA guidelines that recommend an algorithmic approach to treatment for these patients, and that a trial of different medications and strategies is sometimes needed to find the approach that works best for each patient's unique situation.  I reinforced the importance of stress management, relaxation, avoiding triggers, and pain management in the approach to pelvic pain.  She strongly desires cystoscopy for evaluation for bladder cancer with her family history.  I discussed my low suspicion, but she is fairly adamant she would like to pursue a cystoscopy and we will coordinate.  -Extensive information about IC/chronic bladder pain syndrome provided, recommended avoiding bladder irritants, diet information provided, smoking cessation -Consider OAB medication or amitriptyline in the future for IC type picture -  Follow-up for cystoscopy per patient request   Legrand Rams, MD 06/15/2023  [facility] [address] [place], [place] [address] [phone]

## 2023-06-26 ENCOUNTER — Ambulatory Visit: Payer: No Typology Code available for payment source | Admitting: Urology

## 2023-07-12 NOTE — Progress Notes (Signed)
 Virtual Visit  Subjective: Subjective Patient ID: Stefanie Kelley is a 37 y.o. female.  HPI  Nose Complaint Duration of current symptoms:  8 days Onset quality:  Slowly over time Associated symptoms: nasal congestion, headache, postnasal drip and runny nose   Associated symptoms comment:  Ear pain Treatments tried:  Over-the-counter medication, saline spray/rinse and nasal steroid spray (Mucinex , Tylenol  Sinus, Nasal Spray) Response to treatment:  No change Special considerations:  None apply  Review of Systems  HENT:  Positive for congestion, ear pain, postnasal drip, rhinorrhea and sinus pressure.   Neurological:  Positive for headaches.   Social History   Tobacco Use  Smoking Status Never  Smokeless Tobacco Never  Tobacco Comments   Advised Do Not Start   Past Medical History:  Diagnosis Date  . Diabetes mellitus (CMS/HCC and HHS/HCC)    Past Surgical History:  Procedure Laterality Date  . CHOLECYSTECTOMY    . TONSILLECTOMY     History reviewed. No pertinent family history.  Objective: Objective Physical Exam Constitutional:      Appearance: Normal appearance.  HENT:     Right Ear: Tenderness present.     Left Ear: No tenderness.     Ears:     Comments: Provider directed patient to palpate external ears. Pt denies pain with self manipulation of pinna and tragus bilaterally    Nose: Congestion and rhinorrhea present. Rhinorrhea is purulent.     Right Sinus: Maxillary sinus tenderness present. No frontal sinus tenderness.     Left Sinus: Maxillary sinus tenderness present. No frontal sinus tenderness.     Comments: Patient demonstrated self-guided assessment appropriately.Upon self- palpation of sinus cavities and neck, patient denies any complications except tenderness to maxillary sinuses. Denies pain upon flexing of neck. Denies any complications and none noted with appropriate with inhalation and exhalation .    Mouth/Throat:     Lips: Pink.     Comments:  Patient demonstrated self-guided assessment appropriately. Able to visualize throat adequately.  Eyes:     Comments: Eyes observed on camera. Sclera white. No discharge. Pupils are equal.   Pulmonary:     Effort: Pulmonary effort is normal.     Comments: Noted unlabored breathing/respiratory effort normal. Pt able to inspire/expire without coughing, or respiratory distress. No auditory wheezing heard. Patient is able to talk in complete sentences.  Unable to physically assess lung sounds due to visit being conducted virtually.   Musculoskeletal:     Cervical back: Normal range of motion. No muscular tenderness.  Lymphadenopathy:     Cervical: Cervical adenopathy present.     Right cervical: No superficial cervical adenopathy.    Left cervical: Superficial cervical adenopathy present.     Comments: Patient demonstrated self-guided assessment appropriately. Able to correctly palpate lymph nodes.   Reports tenderness & enlargement noted to lymph nodes with palpating.  Neurological:     Mental Status: She is alert.  Psychiatric:        Mood and Affect: Mood normal.       Assessment/Plan: Assessment HPI provided by Self  Based on today's visit:history and physical exam only, as no relevant testing deemed necessary patient's visit diagnosis is/includes  1. Acute non-recurrent maxillary sinusitis    Patient has a history of chronic conditions and those listed in the visit diagnoses were reviewed today. They are currently stable on medications.   Plan Treatment plan includes:  Orders Placed: No orders of the defined types were placed in this encounter.  Medications ordered this visit  Signed Prescriptions Disp Refills  . amoxicillin -clavulanate (AUGMENTIN ) 875-125 mg tablet 14 tablet 0    Sig: Take 1 tablet by mouth 2 (two) times a day for 7 days    Current medication list and any new medications prescribed or recommended today were reviewed with the patient and specific  instructions were provided Yes  Provider Recommendations    PLAN:  Acute Bacterial Rhinosinusitis based on patient's history and clinical presentation.  Prescribed Augmentin , please take as directed. Take with food. Recommend using Allegra during the day, Zyrtec at bedtime and Flonase 1-2 times a day to relieve nasal congestion and reduce post nasal drip.    Advised supportive treatment:  Rest and drink plenty of water/fluids (unless your doctor has told you otherwise) Use a humidifier or run a hot shower to create steam 3-4 times a day for  approximately 10 minutes at a time (This helps lessen congestion)  Use salt water sprays (saline sprays) as directed Apply a warm, moist washcloth to your face 3-4 times a day If you were prescribed an antibiotic finish it all even if you start to feel better Avoid tobacco smoke and other environmental irritants   Seek Medical Care Immediately from your primary care doctor, urgent care center or emergency room, if:   You have increasing pain or severe headaches You have nausea, vomiting, or drowsiness You have swelling around your face You have vision problems You have a stiff neck You have trouble breathing You develop a fever of 101.2 or higher.    Patient verbalizes understanding and agreed to plan.     Patient appeared stable throughout visit and showed no signs of distress on video. Red flags and plan of care reviewed and discussed with patient. Verbalized understanding and voiced no questions or concerns. E-Clinic virtual visit completed within [facility] guidelines. Provider directed patient of what to do during physical exam to assist in the assessment of the patient. Physical exam was limited due to video visit.  It is recommended for everyone to participate in an in-person, annual physical wellness exam.   If you have any questions or concerns about your visit, please contact our virtual care customer support center at  [phone]    RX sent electronically to patient pharmacy at 8:47 AM [facility] [id number] [place], [place] - [address] [phone]     Follow up care instructions were provided and reviewed?with the  Patient. All questions were answered. Patient verbalized understanding of plan of care today.                                             I have verified the patient's location, and I am licensed to practice in that state. Audio and video technology were used to conduct this virtual visit. Patient (or parent/guardian as applicable) consented to virtual care.  Patient is: not a minor

## 2023-07-24 NOTE — Progress Notes (Signed)
 PATIENT PROFILE: Stefanie Kelley is a 38 y.o. female who presents to the [facility] for consultation at the request of PA Tumey  Brief History   All prior office notes, lab results, imaging studies, and procedure reports reviewed as appropriate.  # Epigastric Pain # Dysphagia # Constipation   Interval History   Stefanie Kelley is a 38 y/o lady with history of morbid obesity, depression, and nephrolithiasis here for chief complaint of epigastric pain and solid food dysphagia. She'll feel the food stick right below her xiphoid process and eventually pass. Bread gives her the most trouble. No problem with liquids. No GERD symptoms. No blood thinners. No neck surgeries. No significant family history of GI malignancies.  GENERAL REVIEW OF SYSTEMS:  Review of Systems  Constitutional:  Negative for chills and fever.  Respiratory:  Negative for shortness of breath.   Cardiovascular:  Negative for chest pain.  Gastrointestinal:  Positive for constipation and heartburn.  Musculoskeletal:  Negative for joint pain.  Skin:  Negative for rash.  Neurological:  Negative for focal weakness.  Psychiatric/Behavioral:  Negative for substance abuse.   All other systems reviewed and are negative.    MEDICATIONS: Outpatient Encounter Medications as of 07/24/2023  Medication Sig Dispense Refill  . cyclobenzaprine (FLEXERIL) 10 MG tablet TAKE 1 TABLET BY MOUTH TWICE A DAY AS NEEDED 20 tablet 0  . metFORMIN (GLUCOPHAGE) 500 MG tablet Take 1 tablet (500 mg total) by mouth 2 (two) times daily with meals 60 tablet 11  . tamsulosin  (FLOMAX ) 0.4 mg capsule Take 1 capsule (0.4 mg total) by mouth once daily for 7 days Take 30 minutes after same meal each day. 7 capsule 0  . sertraline (ZOLOFT) 50 MG tablet Take 1 tablet (50 mg total) by mouth 2 (two) times daily (Patient not taking: Reported on 07/24/2023) 180 tablet 1   No facility-administered encounter medications on file as of 07/24/2023.     ALLERGIES: Latex and Macrobid [nitrofurantoin monohyd/m-cryst]  PAST MEDICAL HISTORY: Past Medical History:  Diagnosis Date  . Abnormal cytology   . Anxiety   . Depression   . GERD (gastroesophageal reflux disease)   . Headache   . History of abnormal cervical Pap smear   . Hypertension    only with pregnancy    PAST SURGICAL HISTORY: Past Surgical History:  Procedure Laterality Date  . CHOLECYSTECTOMY  2014  . FRACTURE SURGERY    . TONSILLECTOMY    . TUBAL LIGATION       FAMILY HISTORY: Family History  Problem Relation Name Age of Onset  . Anxiety Mother Bascom   . Obesity Mother Bascom   . Thyroid disease Mother Bascom   . Obesity Father    . Diabetes type II Father    . Alcohol abuse Father    . High blood pressure (Hypertension) Brother    . Diabetes type II Brother Oneil        He has type 1  . Anxiety Maternal Grandmother Consuelo   . Diabetes type II Maternal Grandmother Bonnie   . Glaucoma Maternal Grandmother Consuelo   . Hyperlipidemia (Elevated cholesterol) Maternal Grandmother Bonnie   . High blood pressure (Hypertension) Maternal Grandmother Bonnie   . Colon polyps Maternal Grandfather Jimmy   . Skin cancer Maternal Grandfather Jimmy   . Prostate cancer Maternal Grandfather Jimmy   . High blood pressure (Hypertension) Maternal Grandfather Jimmy   . Hyperlipidemia (Elevated cholesterol) Maternal Grandfather Jimmy   . Glaucoma Maternal Grandfather Jimmy   .  Coronary Artery Disease (Blocked arteries around heart) Maternal Grandfather Jimmy      SOCIAL HISTORY: Social History   Socioeconomic History  . Marital status: Single  Tobacco Use  . Smoking status: Passive Smoke Exposure - Never Smoker  . Smokeless tobacco: Never  . Tobacco comments:    vapes  Vaping Use  . Vaping status: Never Used  Substance and Sexual Activity  . Alcohol use: No  . Drug use: Never  . Sexual activity: Not Currently    Partners: Male    Birth control/protection: None   Social History Narrative   Single, lives with parents and son, born 24 and daughter, born 2003. High school diploma. SAHM. Twice a week follow an exercise routine on the TV with her daughter.    Social Drivers of Health   Financial Resource Strain: High Risk (07/07/2023)   Overall Financial Resource Strain (CARDIA)   . Difficulty of Paying Living Expenses: Hard  Food Insecurity: Food Insecurity Present (07/07/2023)   Hunger Vital Sign   . Worried About Programme researcher, broadcasting/film/video in the Last Year: Often true   . Ran Out of Food in the Last Year: Often true  Transportation Needs: No Transportation Needs (07/07/2023)   PRAPARE - Transportation   . Lack of Transportation (Medical): No   . Lack of Transportation (Non-Medical): No    Vitals:   07/24/23 1517  BP: 138/88  BP Location: Left forearm  Patient Position: Sitting  BP Cuff Size: Adult  Pulse: 78  Weight: (!) 138.8 kg (306 lb)  Height: 167.6 cm (5' 6)    Wt Readings from Last 3 Encounters:  07/24/23 (!) 138.8 kg (306 lb)  05/03/23 (!) 135.2 kg (298 lb)  05/13/22 (!) 141 kg (310 lb 12.8 oz)     PHYSICAL EXAM: Pleasant, non-toxic appearing on exam bench. Answers all questions appropriately and thoroughly.  General: NAD, alert and oriented x 4 HEENT: PEERLA, EOMI, anciteric  Neck: supple, no JVD or thyromegaly. No lymphadenopathy.  Respiratory: No respiratory distress Cardiac: RRR, no murmur, rub, or gallop  GI: soft, no TTP, no HSM, no rebound or guarding Msk: no edema, well perfused with 2+ pulses, Skin: Skin color, texture, turgor normal, no rashes or lesions Lymph: no LAD Neuro: No focal deficits  REVIEW OF DATA: I have reviewed the following data today: Previous GI records  ASSESSMENT AND PLAN: Stefanie Kelley is a 38 y.o. female presenting for an initial consultation.   Diagnoses and all orders for this visit:  Esophageal dysphagia -     Ambulatory Referral to Upper Endoscopy   Will evaluate with EGD. Suspect a  functional disorder. If EGD unremarkable can trial period of PPI. Return to clinic in 6 months or sooner if needed.  I spent a total of 31 minutes in both face-to-face and non-face-to-face activities, excluding procedures performed, for this visit on the date of this encounter.   KYM Frederic Schick MD, MPH [facility], Gastroenterology [address] [place], [place] [address] [phone]

## 2023-07-26 NOTE — Progress Notes (Signed)
 Virtual Visit  Subjective: Subjective Patient ID: Stefanie Kelley is a 38 y.o. female.  Had VC visit on 4/2 and was treated with antibiotic but did not clear completely and is feeling the same, has been taking Flonase, mucinex  and the prescribed Augmentin .  Denies any fever , cough or SOB.   HPI  Nose Complaint Duration of current symptoms:  3 weeks Onset quality:  Suddenly Associated symptoms: nasal congestion, headache, nasal pain, runny nose and sinus pain   Associated symptoms: no myalgias, no chills, no cough, no appetite change, no decreased sense of smell/taste, no diarrhea, no fatigue, no fever, no foreign body, no nausea, no nosebleeds, no other, no postnasal drip, no rash, no sneezing, no sore throat, no diaphoresis, no swollen glands and no vomiting   Treatments tried:  Antibiotics, rest, humidifier or steam and over-the-counter medication (flonase, mucinex  and augmentin ) Special considerations:  None apply Any patient-supplied information was reviewed and discussed with the patient.: Attestation    Review of Systems  Constitutional:  Negative for appetite change, chills, diaphoresis, fatigue and fever.  HENT:  Positive for congestion, ear pain, rhinorrhea, sinus pressure and sinus pain. Negative for nosebleeds, postnasal drip, sneezing and sore throat.   Respiratory:  Negative for cough.   Gastrointestinal:  Negative for diarrhea, nausea and vomiting.  Musculoskeletal:  Negative for myalgias.  Skin:  Negative for rash.  Neurological:  Positive for headaches.   Social History   Tobacco Use  Smoking Status Never  Smokeless Tobacco Never  Tobacco Comments   Advised Do Not Start   Past Medical History:  Diagnosis Date  . Diabetes mellitus (CMS/HCC and HHS/HCC)   . Fibromyalgia    Past Surgical History:  Procedure Laterality Date  . CHOLECYSTECTOMY    . TONSILLECTOMY     No family history on file.  Objective: Objective Physical Exam Constitutional:      General:  She is not in acute distress.    Appearance: Normal appearance.  HENT:     Head: Normocephalic.     Ears:     Comments: Provider guided patient with self-assessment, patient conducted appropriately. No tenderness or hearing changes with pinna manipulation or pressing on tragus.    Nose: Nasal tenderness, congestion and rhinorrhea present. Rhinorrhea is clear and purulent.     Right Sinus: Maxillary sinus tenderness and frontal sinus tenderness present.     Left Sinus: Maxillary sinus tenderness and frontal sinus tenderness present.     Comments: Patient properly palpated frontal and maxillary sinuses.    Mouth/Throat:     Mouth: Mucous membranes are moist.     Pharynx: Posterior oropharyngeal erythema present. No oropharyngeal exudate.  Pulmonary:     Effort: Pulmonary effort is normal.     Comments: Patient speaking in clear sentences, no respiratory distress noted. Breathing pattern appears to be normal.  Provider guided patient with self-assessment, patient conducted appropriately. Patient able to induce cough, nonproductive. Patient took deep breaths, states no wheezing sounds with inspiration or expiration. Lymphadenopathy:     Head:     Right side of head: No submental, submandibular, tonsillar or preauricular adenopathy.     Left side of head: No submental, submandibular, tonsillar or preauricular adenopathy.     Cervical: Cervical adenopathy present.     Right cervical: Superficial cervical adenopathy present.     Left cervical: Superficial cervical adenopathy present.     Comments: Provider guided patient with self-assessment of lymph nodes, patient conducted appropriately.  Neurological:     Mental  Status: She is alert.  Psychiatric:        Mood and Affect: Mood normal.        Behavior: Behavior normal. Behavior is cooperative.        Thought Content: Thought content normal.        Judgment: Judgment normal.       Assessment/Plan: Assessment HPI provided by  Self  Based on today's visit:history and physical exam only, as no relevant testing deemed necessary patient's visit diagnosis is/includes  1. Acute recurrent pansinusitis    Patient has a history of chronic conditions and those listed in the visit diagnoses were reviewed today. They are currently stable not on medications.   Plan Treatment plan includes:  Orders Placed: No orders of the defined types were placed in this encounter.  Medications ordered this visit   Signed Prescriptions Disp Refills  . azithromycin  (ZITHROMAX ) 250 MG tablet 6 tablet 0    Sig: Take 2 tablets (500 mg total) by mouth daily for 1 day, THEN 1 tablet (250 mg total) daily for 4 days.    Current medication list and any new medications prescribed or recommended today were reviewed with the patient and specific instructions were provided Yes  Provider Recommendations :  Patient reporting that she always go through this the only antibiotic that works for her is zpak and requesting for that if possible.    Follow up care instructions were provided and reviewed?with the  Patient. All questions were answered. Patient verbalized understanding of plan of care today.   Recommend fluids, humidification, warm tea with lemon and honey, OTC nasal saline and Flonase as directed for sinus congestion, OTC Tylenol  as directed for sinus pain. Follow up with PCP/UC if not improved by day 3 of antibiotics of illness or sooner for any worsening symptoms or signs (severe or persistent headache, neck stiffness/pain, redness or swelling of cheek or around eyes, vision changes, chest pain or difficulty breathing). Sinusitis patient education article attached for patient to review. Patient verbalized understanding. No further questions.   Discussed potential medication interactions with patient. Reviewed possible risks, side effects and warning signs and when to follow up if needed. Patient wishes to proceed with prescribed  medication.                                          I have verified the patient's location, and I am licensed to practice in that state. Audio and video technology were used to conduct this virtual visit. Patient (or parent/guardian as applicable) consented to virtual care.  Patient is: not a minor

## 2023-07-31 ENCOUNTER — Ambulatory Visit: Admission: RE | Admit: 2023-07-31 | Source: Home / Self Care

## 2023-07-31 ENCOUNTER — Encounter: Admission: RE | Payer: Self-pay | Source: Home / Self Care

## 2023-07-31 SURGERY — EGD (ESOPHAGOGASTRODUODENOSCOPY)
Anesthesia: General

## 2023-08-10 ENCOUNTER — Other Ambulatory Visit: Admitting: Urology

## 2023-09-19 ENCOUNTER — Telehealth: Payer: Self-pay | Admitting: Clinical Medical Laboratory

## 2023-09-19 NOTE — Telephone Encounter (Signed)
 Copied from CRM [id number]. Topic: Appointments - Scheduling Inquiry for Clinic >> Sep 19, 2023 12:13 PM Stefanie Kelley wrote: Reason for CRM: Patient called in wanting to schedule a new patient appointment with NP Jolanda Nation, informed her that she was discharged from practice so unable to schedule . She would like for someone to give her a callback on why she was discharg and of she could be seen back

## 2023-09-26 NOTE — Telephone Encounter (Signed)
 Patient returned call re: dismissal. Explained the below, from 2015. Provided patient with number on the dismissal letter to see if she can find another provider that meets her needs.

## 2023-10-18 NOTE — Progress Notes (Signed)
  [facility] Visit Note    Stefanie Kelley is a 38 y.o. female who presents with biometric screening.   History obtained from patient.   Assessment & Plan:    Assessment & Plan Screening for diabetes mellitus (DM)  Orders: .  NON-DIABETIC POCT Lipid + Glucose Order  Screening for cholesterol level  Orders: .  NON-DIABETIC POCT Lipid + Glucose Order   Today we completed screening for the following health related conditions: Diabetes and Hyperlipidemia. Results were discussed and reviewed today. In additional to continuing to maintain routine appointments with your primary care provider, also try to incorporate choices that will promote a heart healthy lifestyle. Focus on healthy dieting, at least 150 minutes of exercise per week and maintain adequate water intake.      No follow-ups on file.  Subjective     [facility] Screening Waist Measurement (in inches):  59    No Known Allergies  Review of Systems  All other systems reviewed and are negative.    Objective     Vital Signs: BP 118/81   Ht 5' 6 (1.676 m)   Wt (!) 306 lb (139 kg)   BMI 49.39 kg/m    Physical Exam Constitutional:      Appearance: Normal appearance.  Cardiovascular:     Rate and Rhythm: Normal rate and regular rhythm.     Heart sounds: Normal heart sounds.  Pulmonary:     Effort: Pulmonary effort is normal.     Breath sounds: Normal breath sounds.  Skin:    General: Skin is warm and dry.  Neurological:     Mental Status: She is alert and oriented to person, place, and time.  Psychiatric:        Mood and Affect: Mood normal.        Behavior: Behavior normal.                   I attest that I have spent 20 minutes counseling the patient/guardian. The counseling included a review of pathophysiology, risk factors, testing, how testing is performed and what test results mean. In addition, a review of the patient's medical history was completed, which was used as part of my  medical decision making. The patient was educated on risk factor reductions including lifestyle modifications, tobacco/alcohol cessation as applicable, healthy diet, and exercise program.  Utilizing shared decision making the patient and I developed a management and treatment plan specific to their needs. We discussed the importance of compliance with management and follow up plans.  Patient was provided an education sheet.  An opportunity was provided to patient to ask any questions which were answered. Patient/guardian agrees with management plan.

## 2023-11-27 ENCOUNTER — Other Ambulatory Visit: Payer: Self-pay

## 2023-11-27 ENCOUNTER — Ambulatory Visit: Admission: EM | Admit: 2023-11-27 | Discharge: 2023-11-27 | Disposition: A | Discharge status: Home / Self Care

## 2023-11-27 DIAGNOSIS — R519 Headache, unspecified: Secondary | ICD-10-CM

## 2023-11-27 MED ORDER — KETOROLAC TROMETHAMINE 30 MG/ML IJ SOLN
30.0000 mg | Freq: Once | INTRAMUSCULAR | Status: AC
  Administered 2023-11-27: 30 mg via INTRAMUSCULAR

## 2023-11-27 NOTE — Discharge Instructions (Addendum)
 You were given an injection of Toradol  today.  Do not take any over-the-counter medications for the rest of today.    Follow up with your primary care provider tomorrow.  Go to the emergency department if you have worsening symptoms.

## 2023-11-27 NOTE — ED Triage Notes (Signed)
 Pt is here with blurred vision and headaches for a week now, pt has taken OTC meds to relieve discomfort,

## 2023-11-27 NOTE — ED Provider Notes (Signed)
 CAY RALPH PELT    CSN: [id number] Arrival date & time: 11/27/23  1714      History   Chief Complaint Chief Complaint  Patient presents with   Headache   Blurred Vision    HPI Stefanie Kelley is a 38 y.o. female.  Patient presents with 1 week history of headache and blurred vision.  No falls or injury.  This is not the worst headache of her life.  No OTC medication taken today.  She denies numbness, weakness, dizziness, fever, chills, ear pain, sore throat, cough, shortness of breath, nausea, vomiting.  Patient reports history of migraine headaches.  The history is provided by the patient and medical records.    Past Medical History:  Diagnosis Date   Abdominal pain    Abnormal Pap smear    Depression    on meds, doing ok   Diabetes mellitus without complication (HCC)    Gallstones    GERD (gastroesophageal reflux disease)    Kidney stones    Obesity    Pregnancy induced hypertension    Preterm labor    due to [facility]    Patient Active Problem List   Diagnosis Date Noted   Pregnancy 01/31/2013   AGE (acute gastroenteritis) 01/31/2013   Gallstones 09/25/2012   Calculus of gallbladder with other cholecystitis, without mention of obstruction 09/25/2012   Abdominal pain    Cholelithiasis 09/18/2012   Abdominal pain 09/17/2012   History of gallstones 09/17/2012   Viral gastroenteritis 04/17/2012   Screening for STD (sexually transmitted disease) 03/16/2012   HEMATOCHEZIA 05/24/2010   ABNORMAL PAP SMEAR, LGSIL 05/14/2010   Migraine headache 08/07/2009   Asthma 08/07/2009   Calculus of gallbladder 08/07/2009   PRE-ECLAMPSIA 08/07/2009   Personal history of urinary disorder 08/07/2009   Personal history presenting hazards to health 08/07/2009    Past Surgical History:  Procedure Laterality Date   CHOLECYSTECTOMY  2014   FOOT SURGERY     TONSILLECTOMY      OB History     Gravida  2   Para  1   Term      Preterm      AB      Living  1       SAB      IAB      Ectopic      Multiple      Live Births           Obstetric Comments  1st Menstrual Cycle: 10 1st Pregnancy:  16          Home Medications    Prior to Admission medications   Medication Sig Start Date End Date Taking? Authorizing Provider  amoxicillin  (AMOXIL ) 500 MG capsule Take 500 mg by mouth every 8 (eight) hours. 09/14/23  Yes [provider]  amoxicillin -clavulanate (AUGMENTIN ) 875-125 MG tablet SMARTSIG:1 Tablet(s) By Mouth Every 12 Hours 11/01/23  Yes [provider]  azithromycin  (ZITHROMAX ) 250 MG tablet Take by mouth. 07/26/23  Yes [provider]  cyclobenzaprine (FLEXERIL) 10 MG tablet Take 1 tablet by mouth 2 (two) times daily as needed. 07/05/23  Yes [provider]  doxycycline  (ADOXA) 100 MG tablet SMARTSIG:1 Tablet(s) By Mouth Every 12 Hours 11/14/23  Yes [provider]  ibuprofen  (ADVIL ) 600 MG tablet Take 600 mg by mouth. 07/21/23  Yes [provider]  naproxen (NAPROSYN) 500 MG tablet SMARTSIG:1 Tablet(s) By Mouth Every 12 Hours 09/14/23  Yes [provider]  predniSONE (DELTASONE) 10 MG tablet  SMARTSIG:1 Tablet(s) By Mouth Every 12 Hours 11/14/23  Yes [provider]  varenicline (CHANTIX) 0.5 MG tablet Take by mouth. 09/19/23  Yes [provider]  metFORMIN (GLUCOPHAGE) 500 MG tablet Take by mouth. 08/02/21   [provider]    Family History Family History  Problem Relation Age of Onset   Diabetes Brother    Diabetes Maternal Grandmother    Heart disease Maternal Grandfather    Hypertension Maternal Grandfather    Thyroid disease Mother    Hashimoto's thyroiditis Mother     Social History Social History   Tobacco Use   Smoking status: Former   Smokeless tobacco: Never   Tobacco comments:    Quit 11/2011  Vaping Use   Vaping status: Some Days   Substances: Nicotine, Flavoring  Substance Use Topics   Alcohol use: Yes    Comment: occ, none with  preg   Drug use: No     Allergies   Latex and Nitrofurantoin   Review of Systems Review of Systems  Constitutional:  Negative for chills and fever.  HENT:  Negative for congestion, ear pain and sore throat.   Eyes:  Positive for visual disturbance. Negative for pain, discharge and redness.  Respiratory:  Negative for cough and shortness of breath.   Cardiovascular:  Negative for chest pain and palpitations.  Gastrointestinal:  Negative for nausea and vomiting.  Neurological:  Positive for headaches. Negative for dizziness, syncope, facial asymmetry, speech difficulty, weakness, light-headedness and numbness.     Physical Exam Triage Vital Signs ED Triage Vitals  Encounter Vitals Group     BP      Girls Systolic BP Percentile      Girls Diastolic BP Percentile      Boys Systolic BP Percentile      Boys Diastolic BP Percentile      Pulse      Resp      Temp      Temp src      SpO2      Weight      Height      Head Circumference      Peak Flow      Pain Score      Pain Loc      Pain Education      Exclude from Growth Chart    No data found.  Updated Vital Signs BP 112/79 (BP Location: Left Arm)   Pulse 88   Temp 98.4 F (36.9 C) (Oral)   Resp 20   LMP 11/22/2023 (Exact Date)   SpO2 99%   Breastfeeding No   Visual Acuity Right Eye Distance: 20/16 (with glasses) Left Eye Distance: 20/16 (with glasses) Bilateral Distance:    Right Eye Near:   Left Eye Near:    Bilateral Near:     Physical Exam Constitutional:      General: She is not in acute distress. HENT:     Right Ear: Tympanic membrane normal.     Left Ear: Tympanic membrane normal.     Nose: Nose normal.     Mouth/Throat:     Mouth: Mucous membranes are moist.     Pharynx: Oropharynx is clear.  Eyes:     General:        Right eye: No discharge.        Left eye: No discharge.     Extraocular Movements: Extraocular movements intact.     Conjunctiva/sclera: Conjunctivae normal.     Pupils:  Pupils are  equal, round, and reactive to light.  Cardiovascular:     Rate and Rhythm: Normal rate and regular rhythm.     Heart sounds: Normal heart sounds.  Pulmonary:     Effort: Pulmonary effort is normal. No respiratory distress.     Breath sounds: Normal breath sounds.  Skin:    General: Skin is warm and dry.  Neurological:     General: No focal deficit present.     Mental Status: She is alert and oriented to person, place, and time.     Cranial Nerves: No cranial nerve deficit.     Sensory: No sensory deficit.     Motor: No weakness.     Gait: Gait normal.      UC Treatments / Results  Labs (all labs ordered are listed, but only abnormal results are displayed) Labs Reviewed - No data to display  EKG   Radiology No results found.  Procedures Procedures (including critical care time)  Medications Ordered in UC Medications  ketorolac  (TORADOL ) 30 MG/ML injection 30 mg (30 mg Intramuscular Given 11/27/23 1751)    Initial Impression / Assessment and Plan / UC Course  I have reviewed the triage vital signs and the nursing notes.  Pertinent labs & imaging results that were available during my care of the patient were reviewed by me and considered in my medical decision making (see chart for details).    Headache.  No trauma.  This is not the worst headache of her life.  Afebrile and vital signs are stable.  Exam is reassuring.  Toradol  given here.  Instructed patient not to take OTC medications for the remainder of the day.  Instructed her to follow-up with her PCP tomorrow.  ED precautions given.  Education provided on headache.  She agrees to plan of care.  Final Clinical Impressions(s) / UC Diagnoses   Final diagnoses:  Acute nonintractable headache, unspecified headache type     Discharge Instructions      You were given an injection of Toradol  today.  Do not take any over-the-counter medications for the rest of today.    Follow up with your primary care  provider tomorrow.  Go to the emergency department if you have worsening symptoms.        ED Prescriptions   None    PDMP not reviewed this encounter.   Corlis Burnard DEL, NP 11/27/23 [phone]

## 2024-04-16 ENCOUNTER — Ambulatory Visit: Payer: Self-pay
